# Patient Record
Sex: Female | Born: 1960 | Race: White | Hispanic: No | Marital: Single | Smoking: Never smoker
Health system: Southern US, Community
[De-identification: ages and names within clinical notes are randomized; demographics above are authoritative.]

## PROBLEM LIST (undated history)

## (undated) DIAGNOSIS — Z8719 Personal history of other diseases of the digestive system: Secondary | ICD-10-CM

## (undated) DIAGNOSIS — K219 Gastro-esophageal reflux disease without esophagitis: Secondary | ICD-10-CM

## (undated) DIAGNOSIS — Z8489 Family history of other specified conditions: Secondary | ICD-10-CM

## (undated) DIAGNOSIS — I00 Rheumatic fever without heart involvement: Secondary | ICD-10-CM

## (undated) DIAGNOSIS — R7301 Impaired fasting glucose: Secondary | ICD-10-CM

## (undated) DIAGNOSIS — I10 Essential (primary) hypertension: Secondary | ICD-10-CM

## (undated) DIAGNOSIS — E119 Type 2 diabetes mellitus without complications: Secondary | ICD-10-CM

## (undated) HISTORY — DX: Essential (primary) hypertension: I10

## (undated) HISTORY — DX: Impaired fasting glucose: R73.01

## (undated) HISTORY — DX: Gastro-esophageal reflux disease without esophagitis: K21.9

## (undated) HISTORY — PX: CERVICAL FUSION: SHX112

## (undated) HISTORY — PX: FOOT SURGERY: SHX648

---

## 1998-07-19 ENCOUNTER — Emergency Department (HOSPITAL_COMMUNITY): Admission: EM | Admit: 1998-07-19 | Discharge: 1998-07-19 | Payer: Self-pay | Admitting: Emergency Medicine

## 1998-07-19 ENCOUNTER — Encounter: Payer: Self-pay | Admitting: *Deleted

## 1999-08-18 ENCOUNTER — Emergency Department (HOSPITAL_COMMUNITY): Admission: EM | Admit: 1999-08-18 | Discharge: 1999-08-18 | Payer: Self-pay | Admitting: *Deleted

## 1999-08-23 ENCOUNTER — Emergency Department (HOSPITAL_COMMUNITY): Admission: EM | Admit: 1999-08-23 | Discharge: 1999-08-23 | Payer: Self-pay | Admitting: *Deleted

## 1999-08-23 ENCOUNTER — Encounter: Payer: Self-pay | Admitting: Emergency Medicine

## 1999-08-24 ENCOUNTER — Encounter: Payer: Self-pay | Admitting: Orthopaedic Surgery

## 1999-08-24 ENCOUNTER — Ambulatory Visit (HOSPITAL_COMMUNITY): Admission: RE | Admit: 1999-08-24 | Discharge: 1999-08-24 | Payer: Self-pay | Admitting: Orthopaedic Surgery

## 2000-06-14 ENCOUNTER — Emergency Department (HOSPITAL_COMMUNITY): Admission: EM | Admit: 2000-06-14 | Discharge: 2000-06-14 | Payer: Self-pay | Admitting: Emergency Medicine

## 2000-08-02 ENCOUNTER — Emergency Department (HOSPITAL_COMMUNITY): Admission: EM | Admit: 2000-08-02 | Discharge: 2000-08-02 | Payer: Self-pay | Admitting: Emergency Medicine

## 2002-05-10 ENCOUNTER — Emergency Department (HOSPITAL_COMMUNITY): Admission: EM | Admit: 2002-05-10 | Discharge: 2002-05-10 | Payer: Self-pay | Admitting: Emergency Medicine

## 2002-05-10 ENCOUNTER — Encounter: Payer: Self-pay | Admitting: Emergency Medicine

## 2002-10-26 ENCOUNTER — Emergency Department (HOSPITAL_COMMUNITY): Admission: EM | Admit: 2002-10-26 | Discharge: 2002-10-26 | Payer: Self-pay | Admitting: Emergency Medicine

## 2003-03-08 ENCOUNTER — Other Ambulatory Visit: Admission: RE | Admit: 2003-03-08 | Discharge: 2003-03-08 | Payer: Self-pay | Admitting: Family Medicine

## 2003-03-10 ENCOUNTER — Encounter: Admission: RE | Admit: 2003-03-10 | Discharge: 2003-03-10 | Payer: Self-pay | Admitting: Family Medicine

## 2003-03-10 ENCOUNTER — Encounter: Payer: Self-pay | Admitting: Family Medicine

## 2004-09-11 ENCOUNTER — Inpatient Hospital Stay (HOSPITAL_COMMUNITY): Admission: EM | Admit: 2004-09-11 | Discharge: 2004-09-12 | Payer: Self-pay | Admitting: Emergency Medicine

## 2007-06-19 HISTORY — PX: KNEE SURGERY: SHX244

## 2008-06-18 HISTORY — PX: CYST REMOVAL HAND: SHX6279

## 2012-11-24 ENCOUNTER — Other Ambulatory Visit: Payer: Self-pay | Admitting: *Deleted

## 2012-11-24 ENCOUNTER — Encounter: Payer: Managed Care, Other (non HMO) | Admitting: *Deleted

## 2012-11-24 DIAGNOSIS — E559 Vitamin D deficiency, unspecified: Secondary | ICD-10-CM

## 2012-11-24 DIAGNOSIS — Z0000 Encounter for general adult medical examination without abnormal findings: Secondary | ICD-10-CM

## 2012-11-24 LAB — COMPLETE METABOLIC PANEL WITH GFR
ALT: 20 U/L (ref 0–35)
AST: 21 U/L (ref 0–37)
Albumin: 4.1 g/dL (ref 3.5–5.2)
Alkaline Phosphatase: 46 U/L (ref 39–117)
BUN: 14 mg/dL (ref 6–23)
CO2: 28 mEq/L (ref 19–32)
Calcium: 9.1 mg/dL (ref 8.4–10.5)
Chloride: 103 mEq/L (ref 96–112)
Creat: 0.74 mg/dL (ref 0.50–1.10)
GFR, Est African American: >89 mL/min
GFR, Est Non African American: >89 mL/min
Glucose, Bld: 118 mg/dL — ABNORMAL HIGH (ref 70–99)
Potassium: 4.1 mEq/L (ref 3.5–5.3)
Sodium: 138 mEq/L (ref 135–145)
Total Bilirubin: 0.4 mg/dL (ref 0.3–1.2)
Total Protein: 6.9 g/dL (ref 6.0–8.3)

## 2012-11-24 LAB — LIPID PANEL
Cholesterol: 184 mg/dL (ref 0–200)
HDL: 62 mg/dL (ref >39)
LDL Cholesterol: 105 mg/dL — ABNORMAL HIGH (ref 0–99)
Total CHOL/HDL Ratio: 3 Ratio
Triglycerides: 83 mg/dL (ref <150)
VLDL: 17 mg/dL (ref 0–40)

## 2012-11-24 LAB — TSH: TSH: 1.95 u[IU]/mL (ref 0.350–4.500)

## 2012-11-25 LAB — VITAMIN D 25 HYDROXY (VIT D DEFICIENCY, FRACTURES): Vit D, 25-Hydroxy: 42 ng/mL (ref 30–89)

## 2012-11-26 ENCOUNTER — Ambulatory Visit (INDEPENDENT_AMBULATORY_CARE_PROVIDER_SITE_OTHER): Payer: Managed Care, Other (non HMO) | Admitting: Family Medicine

## 2012-11-26 ENCOUNTER — Encounter: Payer: Self-pay | Admitting: Family Medicine

## 2012-11-26 VITALS — BP 110/78 | HR 75 | Wt 162.0 lb

## 2012-11-26 DIAGNOSIS — R7301 Impaired fasting glucose: Secondary | ICD-10-CM

## 2012-11-26 DIAGNOSIS — F411 Generalized anxiety disorder: Secondary | ICD-10-CM

## 2012-11-26 DIAGNOSIS — I10 Essential (primary) hypertension: Secondary | ICD-10-CM

## 2012-11-26 DIAGNOSIS — K219 Gastro-esophageal reflux disease without esophagitis: Secondary | ICD-10-CM

## 2012-11-26 MED ORDER — LOSARTAN POTASSIUM-HCTZ 50-12.5 MG PO TABS: 1.0000 | ORAL_TABLET | Freq: Every day | ORAL | Status: DC

## 2012-11-26 MED ORDER — CANAGLIFLOZIN 100 MG PO TABS: 1.0000 | ORAL_TABLET | Freq: Every day | ORAL | Status: DC

## 2012-11-26 MED ORDER — DEXLANSOPRAZOLE 60 MG PO CPDR: 60.0000 mg | DELAYED_RELEASE_CAPSULE | Freq: Every day | ORAL | Status: DC

## 2012-11-26 NOTE — Progress Notes (Signed)
  Subjective:    Patient ID: Deborah [name], female    DOB: 10/05/1960, 52 y.o.   MRN: [id number]  HPI  Deborah [name] is here today to go over her lab results, get medication refills and to discuss a few issues:   1)  Hypertension:  Her blood pressure is well controlled with her losartan/HCTZ. She needs this medication refilled.  2)  GERD:  She has done well with her Dexilant and needs it to be refilled.    3)  Mood:  She is no longer taking Celexa.  She has been promoted to a different job position and her stress has improved so she does not feel that she needs to be on it.     Review of Systems  Constitutional: Negative for activity change, fatigue and unexpected weight change.  HENT: Negative.   Eyes: Negative.   Respiratory: Negative for shortness of breath.   Cardiovascular: Negative for chest pain, palpitations and leg swelling.  Gastrointestinal: Negative for diarrhea and constipation.  Endocrine: Negative.   Genitourinary: Negative for difficulty urinating.  Musculoskeletal: Negative.   Skin: Negative.   Neurological: Negative.  Negative for light-headedness.  Hematological: Negative for adenopathy. Does not bruise/bleed easily.  Psychiatric/Behavioral: Negative for sleep disturbance and dysphoric mood. The patient is not nervous/anxious.     Past Medical History  Diagnosis Date  . GERD (gastroesophageal reflux disease)   . Hypertension     Family History  Problem Relation Age of Onset  . Schizophrenia Mother   . Diabetes Mother     Type II  . Heart disease Father   . Hypertension Father   . Hyperlipidemia Father   . Heart disease Brother   . Hypertension Brother   . Cancer Brother     Colon Cancer  . CVA Maternal Aunt   . CVA Maternal Grandmother   . Heart disease Paternal Grandfather     History   Social History Narrative   Marital Status: Single    Sexual Orientation:  Lesbian    Children:  None    Pets: Cats (2)    Living Situation: Lives alone.    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 No distress.  HENT:  Head: Normocephalic.  Eyes: No scleral icterus.  Neck: No thyromegaly present.  Cardiovascular: Normal rate, regular rhythm and normal heart sounds.   Pulmonary/Chest: Effort normal and breath sounds normal.  Abdominal: There is no tenderness.  Musculoskeletal: She exhibits no edema and no tenderness.  Neurological: She is alert.  Skin: Skin is warm and dry.  Psychiatric: She has a normal mood and affect. Her behavior is normal. Judgment and thought content normal.          Assessment & Plan:

## 2012-12-20 ENCOUNTER — Encounter: Payer: Self-pay | Admitting: Family Medicine

## 2012-12-20 DIAGNOSIS — F411 Generalized anxiety disorder: Secondary | ICD-10-CM | POA: Insufficient documentation

## 2012-12-20 DIAGNOSIS — R7301 Impaired fasting glucose: Secondary | ICD-10-CM | POA: Insufficient documentation

## 2012-12-20 DIAGNOSIS — K219 Gastro-esophageal reflux disease without esophagitis: Secondary | ICD-10-CM | POA: Insufficient documentation

## 2012-12-20 DIAGNOSIS — I10 Essential (primary) hypertension: Secondary | ICD-10-CM | POA: Insufficient documentation

## 2012-12-20 NOTE — Assessment & Plan Note (Signed)
Her FBS was elevated to 118.  She is going to try some Invokana.

## 2012-12-20 NOTE — Assessment & Plan Note (Signed)
Deborah [name] seems to be doing okay with her mood as far as her work goes.  She is under some personal stress due to her elderly mother who is very ill.  She thinks that she is doing okay with this and does not need to be on medication.

## 2012-12-20 NOTE — Assessment & Plan Note (Signed)
Refilled her losartan-HCT 

## 2012-12-20 NOTE — Assessment & Plan Note (Signed)
Refilled her Dexilant.

## 2013-01-05 ENCOUNTER — Telehealth: Payer: Self-pay

## 2013-01-05 NOTE — Telephone Encounter (Signed)
A prior authorization form was received, filled out and faxed back to [facility] for Rx (Invokana) at this time. LB

## 2013-01-09 ENCOUNTER — Telehealth: Payer: Self-pay

## 2013-01-09 NOTE — Telephone Encounter (Signed)
A fax has been received stating that the Invokana has been approved effective 01/08/2013 and will remain in effect as long as the member is on the policy. LB  The pharmacy has been notified also.

## 2013-05-28 ENCOUNTER — Encounter: Payer: Self-pay | Admitting: Family Medicine

## 2013-05-28 ENCOUNTER — Ambulatory Visit (INDEPENDENT_AMBULATORY_CARE_PROVIDER_SITE_OTHER): Payer: Managed Care, Other (non HMO) | Admitting: Family Medicine

## 2013-05-28 VITALS — BP 124/79 | HR 65 | Resp 16 | Ht 62.0 in | Wt 152.0 lb

## 2013-05-28 DIAGNOSIS — R7301 Impaired fasting glucose: Secondary | ICD-10-CM

## 2013-05-28 DIAGNOSIS — L719 Rosacea, unspecified: Secondary | ICD-10-CM

## 2013-05-28 DIAGNOSIS — J309 Allergic rhinitis, unspecified: Secondary | ICD-10-CM

## 2013-05-28 LAB — POCT GLYCOSYLATED HEMOGLOBIN (HGB A1C): Hemoglobin A1C: 5.2

## 2013-05-28 MED ORDER — CANAGLIFLOZIN-METFORMIN HCL 50-500 MG PO TABS: 1.0000 | ORAL_TABLET | Freq: Two times a day (BID) | ORAL | Status: DC

## 2013-05-28 MED ORDER — AZELASTINE-FLUTICASONE 137-50 MCG/ACT NA SUSP: 1.0000 | Freq: Two times a day (BID) | NASAL | Status: DC

## 2013-05-28 MED ORDER — METRONIDAZOLE 0.75 % EX CREA: TOPICAL_CREAM | Freq: Two times a day (BID) | CUTANEOUS | Status: AC

## 2013-05-28 NOTE — Patient Instructions (Addendum)
1)  Blood Sugar - PERFECT with an A1c of 5.2%.  We are going to change your medication to Invokamet 50/500 1 pill at night.    2)  BP - PERFECT you can try taking 1/2 tab daily.  3)  Skin - You appear to have Rosacea.  Try the metrocream twice a day.  4)  Allergies - Try the Dymista and the Norel AD.  You would also benefit from the [place] Med Sinus Rinse (Distilled Water) or Simply Saline.     Rosacea Rosacea is a long-term (chronic) condition that affects the skin of the face (cheeks, nose, brow, and chin) and sometimes the eyes. Rosacea causes the blood vessels near the surface of the skin to enlarge, resulting in redness. This condition usually begins after age 43. It occurs most often in light-skinned women. Without treatment, rosacea tends to get worse over time. There is no cure for rosacea, but treatment can help control your symptoms. CAUSES  The cause is unknown. It is thought that some people may inherit a tendency to develop rosacea. Certain triggers can make your rosacea worse, including:  Hot baths.  Exercise.  Sunlight.  Very hot or cold temperatures.  Hot or spicy foods and drinks.  Drinking alcohol.  Stress.  Taking blood pressure medicine.  Long-term use of topical steroids on the face. SYMPTOMS   Redness of the face.  Red bumps or pimples on the face.  Red, enlarged nose (rhinophyma).  Blushing easily.  Red lines on the skin.  Irritated or burning feeling in the eyes.  Swollen eyelids. DIAGNOSIS  Your caregiver can usually tell what is wrong by asking about your symptoms and performing a physical exam. TREATMENT  Avoiding triggers is an important part of treatment. You will also need to see a skin specialist (dermatologist) who can develop a treatment plan for you. The goals of treatment are to control your condition and to improve the appearance of your skin. It may take several weeks or months of treatment before you notice an improvement in your  skin. Even after your skin improves, you will likely need to continue treatment to prevent your rosacea from coming back. Treatment methods may include:  Using sunscreen or sunblock daily to protect the skin.  Antibiotic medicine, such as metronidazole, applied directly to the skin.  Antibiotics taken by mouth. This is usually prescribed if you have eye problems from your rosacea.  Laser surgery to improve the appearance of the skin. This surgery can reduce the appearance of red lines on the skin and can remove excess tissue from the nose to reduce its size. HOME CARE INSTRUCTIONS  Avoid things that seem to trigger your flare-ups.  If you are given antibiotics, take them as directed. Finish them even if you start to feel better.  Use a gentle facial cleanser that does not contain alcohol.  You may use a mild facial moisturizer.  Use a sunscreen or sunblock with SPF 30 or greater.  Wear a green-tinted foundation powder to conceal redness, if needed. Choose cosmetics that are noncomedogenic. This means they do not block your pores.  If your eyelids are affected, apply warm compresses to the eyelids. Do this up to 4 times a day or as directed by your caregiver. SEEK MEDICAL CARE IF:  Your skin problems get worse.  You feel depressed.  You lose your appetite.  You have trouble concentrating.  You have problems with your eyes, such as redness or itching. MAKE SURE YOU:  Understand these instructions.                                                        Will watch your condition.  Will get help right away if you are not doing well or get worse. Document Released: 07/12/2004 Document Revised: 12/04/2011 Document Reviewed: 05/15/2011 [facility] Patient Information 2014 [place], [place].

## 2013-05-28 NOTE — Progress Notes (Signed)
Subjective:    Patient ID: Deborah [name], female    DOB: [birth year], 52 y.o.   MRN: [id number]  HPI  Deborah [name] is here today to discuss the following conditions:       1)  IFG:  She has been taking Invokana (100 mg, once daily).  She has not had major side effects but every now and then she has experienced some "shaking."  She wonders if this is related to the Invokana.  She has been following Weight [employer] and has lost 10 lbs since her last office visit.    2)  Blood Pressure:  Her blood pressure remains controlled with her losartan (50/12.5 mg, once daily).   Review of Systems  Constitutional: Negative for activity change, fatigue and unexpected weight change.  HENT: Negative.   Eyes: Negative.   Respiratory: Negative for shortness of breath.   Cardiovascular: Negative for chest pain, palpitations and leg swelling.  Gastrointestinal: Negative for diarrhea and constipation.  Endocrine: Negative.   Genitourinary: Negative for difficulty urinating.  Musculoskeletal: Negative.   Skin: Negative.   Neurological: Negative for light-headedness.       She has felt a little "shaky" at times.    Hematological: Negative for adenopathy. Does not bruise/bleed easily.  Psychiatric/Behavioral: Negative for sleep disturbance and dysphoric mood. The patient is not nervous/anxious.   All other systems reviewed and are negative.     Past Medical History  Diagnosis Date  . GERD (gastroesophageal reflux disease)   . Hypertension      Past Surgical History  Procedure Laterality Date  . Foot surgery    . Knee surgery  2009    Right Knee  . Cyst removal hand  2010    Left Wrist  . Cervical fusion      C6     History   Social History Narrative   Marital Status: Single    Sexual Orientation:  Lesbian    Children:  None    Pets: Cats (2)    Living Situation: Lives alone.    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 Schizophrenia Mother   . Diabetes Mother     Type II  . Heart disease Father   . Hypertension Father   . Hyperlipidemia Father   . Heart disease Brother   . Hypertension Brother   . Cancer Brother     Colon Cancer  . CVA Maternal Aunt   . CVA Maternal Grandmother   . Heart disease Paternal Grandfather      Current Outpatient Prescriptions on File Prior to Visit  Medication Sig Dispense Refill  . Canagliflozin (INVOKANA) 100 MG TABS Take 1 tablet (100 mg total) by mouth daily.  30 tablet  5  . dexlansoprazole (DEXILANT) 60 MG capsule Take 1 capsule (60 mg total) by mouth daily.  30 capsule  11  . losartan-hydrochlorothiazide (HYZAAR) 50-12.5 MG per tablet Take 1 tablet by mouth daily.  30 tablet  11   No current facility-administered medications on file prior to visit.     No Known Allergies   Immunization History  Administered Date(s) Administered  . Tdap 12/07/2009      Objective:   Physical Exam  Vitals reviewed. Constitutional: She is oriented to person, place, and time.  Eyes: Conjunctivae are normal. No scleral icterus.  Neck: Neck supple. No thyromegaly present.  Cardiovascular: Normal rate, regular rhythm and normal heart sounds.   Pulmonary/Chest: Effort normal and breath sounds normal.  Musculoskeletal: She exhibits no edema and no tenderness.  Lymphadenopathy:    She has no cervical adenopathy.  Neurological: She is alert and oriented to person, place, and time.  Skin: Skin is warm and dry. There is erythema.  Psychiatric: She has a normal mood and affect. Her behavior is normal. Judgment and thought content normal.      Assessment & Plan:    Deborah [name] was seen today for medication management.  Diagnoses  and associated orders for this visit:  IFG (impaired fasting glucose) - POCT glycosylated hemoglobin (Hb A1C) - Canagliflozin-Metformin HCl (INVOKAMET) 50-500 MG TABS; Take 1 tablet by mouth 2 (two) times daily.  Rosacea - metroNIDAZOLE (METROCREAM) 0.75 % cream; Apply topically 2 (two) times daily.  Allergic rhinitis - Azelastine-Fluticasone 137-50 MCG/ACT SUSP; Place 1 spray into the nose 2 (two) times daily.   TIME SPENT "FACE TO FACE" WITH PATIENT -  30 MINS

## 2013-08-18 ENCOUNTER — Encounter (INDEPENDENT_AMBULATORY_CARE_PROVIDER_SITE_OTHER): Payer: Self-pay

## 2013-08-18 ENCOUNTER — Ambulatory Visit (INDEPENDENT_AMBULATORY_CARE_PROVIDER_SITE_OTHER): Payer: Managed Care, Other (non HMO) | Admitting: Family Medicine

## 2013-08-18 ENCOUNTER — Encounter: Payer: Self-pay | Admitting: Family Medicine

## 2013-08-18 ENCOUNTER — Other Ambulatory Visit (HOSPITAL_COMMUNITY)
Admission: RE | Admit: 2013-08-18 | Discharge: 2013-08-18 | Disposition: A | Discharge status: Home / Self Care | Payer: Managed Care, Other (non HMO) | Source: Ambulatory Visit | Attending: Family Medicine | Admitting: Family Medicine

## 2013-08-18 VITALS — BP 145/91 | HR 61 | Resp 16 | Ht 62.0 in | Wt 152.0 lb

## 2013-08-18 DIAGNOSIS — Z01419 Encounter for gynecological examination (general) (routine) without abnormal findings: Secondary | ICD-10-CM | POA: Insufficient documentation

## 2013-08-18 DIAGNOSIS — Z1151 Encounter for screening for human papillomavirus (HPV): Secondary | ICD-10-CM | POA: Insufficient documentation

## 2013-08-18 DIAGNOSIS — Z0000 Encounter for general adult medical examination without abnormal findings: Secondary | ICD-10-CM

## 2013-08-18 DIAGNOSIS — Z124 Encounter for screening for malignant neoplasm of cervix: Secondary | ICD-10-CM

## 2013-08-18 LAB — POCT URINALYSIS DIPSTICK
Bilirubin, UA: NEGATIVE
Blood, UA: NEGATIVE
Ketones, UA: NEGATIVE
Leukocytes, UA: NEGATIVE
Nitrite, UA: NEGATIVE
Protein, UA: NEGATIVE
Spec Grav, UA: 1.01
Urobilinogen, UA: NEGATIVE
pH, UA: 5

## 2013-08-18 NOTE — Progress Notes (Signed)
Subjective:    Patient ID: Deborah [name], female    DOB: September 29, 1960, 53 y.o.   MRN: [id number]  HPI  Nyrah is here today for her annual CPE with pap smear.  She has done well since her last office visit.  Her LMP was on 05/18/2014.   Review of Systems  Constitutional: Negative for activity change, appetite change, fatigue and unexpected weight change.  HENT: Negative for congestion, dental problem, ear pain, hearing loss, trouble swallowing and voice change.   Eyes: Negative for pain, redness and visual disturbance.  Respiratory: Negative for cough and shortness of breath.   Cardiovascular: Negative for chest pain, palpitations and leg swelling.  Gastrointestinal: Negative for nausea, vomiting, abdominal pain, diarrhea, constipation and blood in stool.  Endocrine: Negative for cold intolerance, heat intolerance, polydipsia, polyphagia and polyuria.  Genitourinary: Negative for dysuria, urgency, frequency, hematuria, vaginal discharge and pelvic pain.  Musculoskeletal: Negative for arthralgias, back pain, joint swelling, myalgias and neck pain.  Skin: Negative for rash.  Neurological: Negative for dizziness, weakness and headaches.  Hematological: Negative for adenopathy. Does not bruise/bleed easily.  Psychiatric/Behavioral: Negative for sleep disturbance, dysphoric mood and decreased concentration. The patient is not nervous/anxious.      Past Medical History  Diagnosis Date  . GERD (gastroesophageal reflux disease)   . Hypertension      Past Surgical History  Procedure Laterality Date  . Foot surgery    . Knee surgery  2009    Right Knee  . Cyst removal hand  2010    Left Wrist  . Cervical fusion      C6     History   Social History Narrative   Marital Status: Single    Sexual Orientation:  Lesbian    Children:  None    Pets: Cats (2)    Living Situation: Lives alone.    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 Schizophrenia Mother   . Diabetes Mother     Type II  . Heart disease Father   . Hypertension Father   . Hyperlipidemia Father   . Heart disease Brother   . Hypertension Brother   . Cancer Brother     Colon Cancer  . CVA Maternal Aunt   . CVA Maternal Grandmother   . Heart disease Paternal Grandfather      Current Outpatient Prescriptions on File Prior to Visit  Medication Sig Dispense Refill  . Canagliflozin-Metformin HCl (INVOKAMET) 50-500 MG TABS Take 1 tablet by mouth 2 (two) times daily.  60 tablet  5  . dexlansoprazole (DEXILANT) 60 MG capsule Take 1 capsule (60 mg total) by mouth daily.  30 capsule  11  . losartan-hydrochlorothiazide (HYZAAR) 50-12.5 MG per tablet Take 1 tablet by mouth daily.  30 tablet  11  . metroNIDAZOLE (METROCREAM) 0.75 % cream Apply topically 2 (two) times daily.  60 g  11  . Azelastine-Fluticasone 137-50 MCG/ACT SUSP Place 1 spray into the nose 2 (two) times daily.  1 Bottle  11   No current facility-administered medications on file prior to visit.  No Known Allergies   Immunization History  Administered Date(s) Administered  . Tdap 12/07/2009       Objective:   Physical Exam  Constitutional: She is oriented to person, place, and time. She appears well-developed and well-nourished.  HENT:  Head: Normocephalic and atraumatic.  Right Ear: External ear normal.  Left Ear: External ear normal.  Nose: Nose normal.  Mouth/Throat: Oropharynx is clear and moist.  Eyes: Conjunctivae and EOM are normal. Pupils are equal, round, and reactive to light.  Neck: Normal range of motion. No thyromegaly present.  Cardiovascular: Normal rate, regular rhythm, normal heart sounds and intact  distal pulses.  Exam reveals no gallop and no friction rub.   No murmur heard. Pulmonary/Chest: Effort normal and breath sounds normal. Right breast exhibits no inverted nipple, no mass, no nipple discharge, no skin change and no tenderness. Left breast exhibits no inverted nipple, no mass, no nipple discharge, no skin change and no tenderness. Breasts are symmetrical.  Abdominal: Soft. Bowel sounds are normal. Hernia confirmed negative in the right inguinal area and confirmed negative in the left inguinal area.  Genitourinary: Vagina normal and uterus normal. Pelvic exam was performed with patient supine. There is no rash, tenderness or lesion on the right labia. There is no rash, tenderness or lesion on the left labia. No vaginal discharge found.  Musculoskeletal: Normal range of motion. She exhibits no edema and no tenderness.  Lymphadenopathy:    She has no cervical adenopathy.       Right: No inguinal adenopathy present.       Left: No inguinal adenopathy present.  Neurological: She is alert and oriented to person, place, and time. She has normal reflexes.  Skin: Skin is warm and dry.  Psychiatric: She has a normal mood and affect. Her behavior is normal. Judgment and thought content normal.      Assessment & Plan:  Samara DeistKathryn was seen today for annual exam.  Diagnoses and associated orders for this visit:  Routine general medical examination at a health care facility The patient had a normal CPE.  We addressed preventative issues appropriate for her age.  Her U/A and EKG were WNL.   - POCT urinalysis dipstick - EKG 12-Lead  Screening for malignant neoplasm of the cervix Pap smear was done without any difficulty and was [facility]. She will receive her results in the mail. She was told that if her result is abnormal, we will bring her in to discuss the abnormality in great detail.  - Cytology - PAP

## 2013-09-14 ENCOUNTER — Encounter: Payer: Self-pay | Admitting: *Deleted

## 2013-09-15 ENCOUNTER — Encounter: Payer: Self-pay | Admitting: *Deleted

## 2013-10-05 ENCOUNTER — Encounter: Payer: Self-pay | Admitting: Family Medicine

## 2013-11-13 ENCOUNTER — Other Ambulatory Visit: Payer: Self-pay | Admitting: Family Medicine

## 2013-11-14 ENCOUNTER — Other Ambulatory Visit: Payer: Self-pay | Admitting: Family Medicine

## 2013-11-19 ENCOUNTER — Other Ambulatory Visit: Payer: Self-pay | Admitting: *Deleted

## 2013-11-19 ENCOUNTER — Other Ambulatory Visit: Payer: Self-pay | Admitting: Family Medicine

## 2013-11-19 ENCOUNTER — Other Ambulatory Visit: Payer: Managed Care, Other (non HMO)

## 2013-11-19 DIAGNOSIS — Z0000 Encounter for general adult medical examination without abnormal findings: Secondary | ICD-10-CM

## 2013-11-19 DIAGNOSIS — E785 Hyperlipidemia, unspecified: Secondary | ICD-10-CM

## 2013-11-19 DIAGNOSIS — I10 Essential (primary) hypertension: Secondary | ICD-10-CM

## 2013-11-19 LAB — CBC WITH DIFFERENTIAL/PLATELET
Basophils Absolute: 0 10*3/uL (ref 0.0–0.1)
Basophils Relative: 0 % (ref 0–1)
Eosinophils Absolute: 0.1 10*3/uL (ref 0.0–0.7)
Eosinophils Relative: 3 % (ref 0–5)
HCT: 39.1 % (ref 36.0–46.0)
Hemoglobin: 13.8 g/dL (ref 12.0–15.0)
Lymphocytes Relative: 31 % (ref 12–46)
Lymphs Abs: 0.9 10*3/uL (ref 0.7–4.0)
MCH: 33.9 pg (ref 26.0–34.0)
MCHC: 35.3 g/dL (ref 30.0–36.0)
MCV: 96.1 fL (ref 78.0–100.0)
Monocytes Absolute: 0.3 10*3/uL (ref 0.1–1.0)
Monocytes Relative: 10 % (ref 3–12)
Neutro Abs: 1.6 10*3/uL — ABNORMAL LOW (ref 1.7–7.7)
Neutrophils Relative %: 56 % (ref 43–77)
Platelets: 250 10*3/uL (ref 150–400)
RBC: 4.07 MIL/uL (ref 3.87–5.11)
RDW: 12.9 % (ref 11.5–15.5)
WBC: 2.9 10*3/uL — ABNORMAL LOW (ref 4.0–10.5)

## 2013-11-19 LAB — LIPID PANEL
Cholesterol: 189 mg/dL (ref 0–200)
HDL: 62 mg/dL (ref >39)
LDL Cholesterol: 118 mg/dL — ABNORMAL HIGH (ref 0–99)
Total CHOL/HDL Ratio: 3 Ratio
Triglycerides: 47 mg/dL (ref <150)
VLDL: 9 mg/dL (ref 0–40)

## 2013-11-19 LAB — COMPLETE METABOLIC PANEL WITH GFR
ALT: 15 U/L (ref 0–35)
AST: 20 U/L (ref 0–37)
Albumin: 3.9 g/dL (ref 3.5–5.2)
Alkaline Phosphatase: 41 U/L (ref 39–117)
BUN: 18 mg/dL (ref 6–23)
CO2: 29 mEq/L (ref 19–32)
Calcium: 9 mg/dL (ref 8.4–10.5)
Chloride: 103 mEq/L (ref 96–112)
Creat: 0.79 mg/dL (ref 0.50–1.10)
GFR, Est African American: >89 mL/min
GFR, Est Non African American: 86 mL/min
Glucose, Bld: 86 mg/dL (ref 70–99)
Potassium: 4.5 mEq/L (ref 3.5–5.3)
Sodium: 140 mEq/L (ref 135–145)
Total Bilirubin: 0.4 mg/dL (ref 0.2–1.2)
Total Protein: 6.6 g/dL (ref 6.0–8.3)

## 2013-11-20 LAB — TSH: TSH: 1.545 u[IU]/mL (ref 0.350–4.500)

## 2013-11-24 ENCOUNTER — Ambulatory Visit (INDEPENDENT_AMBULATORY_CARE_PROVIDER_SITE_OTHER): Payer: Managed Care, Other (non HMO) | Admitting: Family Medicine

## 2013-11-24 ENCOUNTER — Encounter: Payer: Self-pay | Admitting: Family Medicine

## 2013-11-24 VITALS — BP 133/82 | HR 76 | Resp 16 | Ht 62.0 in | Wt 142.0 lb

## 2013-11-24 DIAGNOSIS — I10 Essential (primary) hypertension: Secondary | ICD-10-CM

## 2013-11-24 DIAGNOSIS — K219 Gastro-esophageal reflux disease without esophagitis: Secondary | ICD-10-CM

## 2013-11-24 DIAGNOSIS — R232 Flushing: Secondary | ICD-10-CM

## 2013-11-24 DIAGNOSIS — R7301 Impaired fasting glucose: Secondary | ICD-10-CM

## 2013-11-24 LAB — FSH/LH
FSH: 63.4 m[IU]/mL
LH: 24.2 m[IU]/mL

## 2013-11-24 LAB — POCT GLYCOSYLATED HEMOGLOBIN (HGB A1C): Hemoglobin A1C: 5.3

## 2013-11-24 MED ORDER — LOSARTAN POTASSIUM-HCTZ 50-12.5 MG PO TABS: 1.0000 | ORAL_TABLET | Freq: Every day | ORAL | Status: AC

## 2013-11-24 MED ORDER — DEXLANSOPRAZOLE 60 MG PO CPDR: 60.0000 mg | DELAYED_RELEASE_CAPSULE | Freq: Every day | ORAL | Status: AC

## 2013-11-24 MED ORDER — CANAGLIFLOZIN-METFORMIN HCL 50-500 MG PO TABS: 1.0000 | ORAL_TABLET | Freq: Two times a day (BID) | ORAL | Status: AC

## 2013-11-24 MED ORDER — CANAGLIFLOZIN-METFORMIN HCL 50-500 MG PO TABS: 1.0000 | ORAL_TABLET | Freq: Two times a day (BID) | ORAL | Status: DC

## 2013-11-24 NOTE — Progress Notes (Signed)
Subjective:    Patient ID: Deborah [name], female    DOB: 07-31-1960, 53 y.o.   MRN: [id number]  HPI  Deborah [name] is here today to follow up on her recent lab results. She is needing medication refills.  1)  Hypertension:  She is doing well on the Hyzaar.  2)  GERD:  Controlled well on the Dexilant.  3)  IFG:  She is doing well on the Invokamet.    Review of Systems  Constitutional: Negative for activity change, appetite change and fatigue.  Cardiovascular: Negative for chest pain, palpitations and leg swelling.  Endocrine: Negative for polydipsia, polyphagia and polyuria.  Psychiatric/Behavioral: Negative for behavioral problems.  All other systems reviewed and are negative.    Past Medical History  Diagnosis Date  . GERD (gastroesophageal reflux disease)   . Hypertension   . Impaired fasting glucose      Past Surgical History  Procedure Laterality Date  . Foot surgery    . Knee surgery  2009    Right Knee  . Cyst removal hand  2010    Left Wrist  . Cervical fusion      C6     History   Social History Narrative   Marital Status: Single    Sexual Orientation:  Lesbian    Children:  None    Pets: Cats (2)    Living Situation: Lives alone.    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 Schizophrenia Mother   . Diabetes Mother     Type II  . Heart disease Father   . Hypertension Father   . Hyperlipidemia Father   . Heart disease Brother   . Hypertension Brother   . Cancer Brother     Colon Cancer  . CVA Maternal Aunt   . CVA Maternal Grandmother   . Heart disease Paternal Grandfather      Current Outpatient Prescriptions on File Prior to Visit    Medication Sig Dispense Refill  . metroNIDAZOLE (METROCREAM) 0.75 % cream Apply topically 2 (two) times daily.  60 g  11   No current facility-administered medications on file prior to visit.     No Known Allergies   Immunization History  Administered Date(s) Administered  . Tdap 12/07/2009       Objective:   Physical Exam  Nursing note and vitals reviewed. Constitutional: She is oriented to person, place, and time.  Eyes: Conjunctivae are normal. No scleral icterus.  Neck: Neck supple. No thyromegaly present.  Cardiovascular: Normal rate, regular rhythm and normal heart sounds.   Pulmonary/Chest: Effort normal and breath sounds normal.  Musculoskeletal: She exhibits no edema and no tenderness.  Lymphadenopathy:    She has no cervical adenopathy.  Neurological: She is alert and oriented to person, place, and time.  Skin: Skin is warm and dry.  Psychiatric: She has a normal mood and affect. Her behavior is normal. Judgment and thought content normal.      Assessment & Plan:    Adell was seen today for medication management.  Diagnoses and associated orders for this visit:  Essential hypertension, benign - losartan-hydrochlorothiazide (HYZAAR) 50-12.5 MG per tablet; Take 1 tablet by mouth daily.  Gastroesophageal reflux disease, esophagitis presence not specified - dexlansoprazole (DEXILANT) 60 MG capsule; Take 1 capsule (60 mg total) by mouth daily.  Impaired fasting glucose - POCT HgB A1C  IFG (impaired fasting glucose) Comments: She is to take one of the Invokamet 50/500 daily.    - Canagliflozin-Metformin HCl (INVOKAMET) 50-500 MG TABS; Take 1 tablet by mouth 2 (two) times daily.  [place] Comments: Checking a FSH/LH.     TIME SPENT "FACE TO FACE" WITH PATIENT -  30 MINS

## 2014-02-01 ENCOUNTER — Encounter: Payer: Self-pay | Admitting: Family Medicine

## 2014-02-01 DIAGNOSIS — R7301 Impaired fasting glucose: Secondary | ICD-10-CM | POA: Insufficient documentation

## 2014-02-01 DIAGNOSIS — R232 Flushing: Secondary | ICD-10-CM | POA: Insufficient documentation

## 2016-05-22 ENCOUNTER — Ambulatory Visit (INDEPENDENT_AMBULATORY_CARE_PROVIDER_SITE_OTHER): Payer: Managed Care, Other (non HMO) | Admitting: Orthopaedic Surgery

## 2016-05-22 ENCOUNTER — Ambulatory Visit (INDEPENDENT_AMBULATORY_CARE_PROVIDER_SITE_OTHER): Payer: Managed Care, Other (non HMO)

## 2016-05-22 ENCOUNTER — Encounter (INDEPENDENT_AMBULATORY_CARE_PROVIDER_SITE_OTHER): Payer: Self-pay | Admitting: Orthopaedic Surgery

## 2016-05-22 VITALS — Ht 62.0 in | Wt 152.0 lb

## 2016-05-22 DIAGNOSIS — M25561 Pain in right knee: Secondary | ICD-10-CM

## 2016-05-22 DIAGNOSIS — G8929 Other chronic pain: Secondary | ICD-10-CM

## 2016-05-22 MED ORDER — METHYLPREDNISOLONE ACETATE 40 MG/ML IJ SUSP
40.0000 mg | INTRAMUSCULAR | Status: AC | PRN
  Administered 2016-05-22: 40 mg via INTRA_ARTICULAR

## 2016-05-22 MED ORDER — LIDOCAINE HCL 1 % IJ SOLN
3.0000 mL | INTRAMUSCULAR | Status: AC | PRN
  Administered 2016-05-22: 3 mL

## 2016-05-22 NOTE — Progress Notes (Signed)
Office Visit Note   Patient: Deborah [name]           Date of Birth: 1961-01-18           MRN: [id number] Visit Date: 05/22/2016              Requested by: Gillian Scarceobyn K Zanard, MD [address] [place], [place] [address] PCP: Birdena JubileeZANARD, ROBYN, MD   Assessment & Plan: Visit Diagnoses:  1. Chronic pain of right knee     Plan: She tolerated the steroid injection well and her right knee. At this point we will order hyaluronic acid for her knee to see if this will help buy her some time and created a stable environment for the knee in terms of a spongy cushioning they can help decrease her pain. We'll see her back on that injection is approved and comes in.  Follow-Up Instructions: Return in about 4 weeks (around 06/19/2016).   Orders:  Orders Placed This Encounter  Procedures  . Large Joint Injection/Arthrocentesis  . XR Knee 1-2 Views Right   No orders of the defined types were placed in this encounter.     Procedures: Large Joint Inj Date/Time: 05/22/2016 8:37 AM Performed by: Kathryne HitchBLACKMAN, Tannen Vandezande Y Authorized by: Kathryne HitchBLACKMAN, Shanita Kanan Y   Location:  Knee Site:  R knee Approach:  Anterolateral Ultrasound Guidance: No   Fluoroscopic Guidance: No   Arthrogram: No   Medications:  3 mL lidocaine 1 %; 40 mg methylPREDNISolone acetate 40 MG/ML     Clinical Data: No additional findings.   Subjective: Chief Complaint  Patient presents with  . Right Knee - Pain    Right knee pain x 1 year. She complains of giving way, most recently yesterday. Pain medial right knee where she believes knot to be. She had to dc exercise due to her pain.   The patient's well-known to me. We performed arthroscopic intervention of this right knee almost a decade ago. We had to remove some of her meniscus due to the complex medial meniscal tear. Time her cartilage looked good throughout the knee. She's been developing some locking catching in her right knee now. She has pain with pivoting  activity and saw the medial joint line of her knees where she points to. She did have a injection of steroid in the fall of this year which did provide some temporary relief for about a month. The pain is worsening again and she points again to the medial side of her knee. HPI  Review of Systems He currently denies any headache, shortness of breath, fever, chills, nausea, vomiting, chest pain.  Objective: Vital Signs: Ht 5\' 2"  (1.575 m)   Wt 152 lb (68.9 kg)   BMI 27.80 kg/m   Physical Exam He is alert and oriented 3 in no acute distress Ortho Exam Examination of her right knee shows no significant effusion. She has a varus deformity that needed to easily correctable. She'll has medial joint line tenderness and pain with pivoting the tibia on the femur and the medial compartment. Her Lockman's shows some slight loosening of that knee but they're stable endpoints. Her lateral knee examined patellofemoral compartment exam are all or both. Range of motions full. Specialty Comments:  No specialty comments available.  Imaging: Xr Knee 1-2 Views Right  Result Date: 05/22/2016 An AP and lateral of her right knee are obtained today. She has significant medial compartment arthritis of her right knee. It's almost bone-on-bone wear this point. The lateral  side looks good as well as the patellofemoral joint. The medial side does have joint space narrowing, para trigger osteophytes, and sclerotic changes.    PMFS History: Patient Active Problem List   Diagnosis Date Noted  . IFG (impaired fasting glucose) 02/01/2014  . Flushing 02/01/2014  . GERD (gastroesophageal reflux disease) 12/20/2012  . Anxiety state, unspecified 12/20/2012  . Essential hypertension, benign 12/20/2012  . Impaired fasting glucose 12/20/2012   Past Medical History:  Diagnosis Date  . GERD (gastroesophageal reflux disease)   . Hypertension   . Impaired fasting glucose     Family History  Problem Relation Age of  Onset  . Schizophrenia Mother   . Diabetes Mother     Type II  . Heart disease Father   . Hypertension Father   . Hyperlipidemia Father   . Heart disease Brother   . Hypertension Brother   . Cancer Brother     Colon Cancer  . CVA Maternal Aunt   . CVA Maternal Grandmother   . Heart disease Paternal Grandfather     Past Surgical History:  Procedure Laterality Date  . CERVICAL FUSION     C6  . CYST REMOVAL HAND  2010   Left Wrist  . FOOT SURGERY    . KNEE SURGERY  2009   Right Knee   Social History   Occupational History  . Not on file.   Social History Main Topics  . Smoking status: Never Smoker  . Smokeless tobacco: Never Used  . Alcohol use 18.6 oz/week    3 Cans of beer, 28 Shots of liquor per week  . Drug use: No  . Sexual activity: Yes    Partners: Female

## 2016-05-30 ENCOUNTER — Telehealth (INDEPENDENT_AMBULATORY_CARE_PROVIDER_SITE_OTHER): Payer: Self-pay | Admitting: Orthopaedic Surgery

## 2016-05-30 NOTE — Telephone Encounter (Signed)
Patient approved for injection and has appt in Jan

## 2016-05-30 NOTE — Telephone Encounter (Signed)
Adam from [employer] wants to check on prior authorization for "monovisc" for pt. Adam's number is [phone]

## 2016-06-20 ENCOUNTER — Encounter (INDEPENDENT_AMBULATORY_CARE_PROVIDER_SITE_OTHER): Payer: Self-pay | Admitting: Orthopaedic Surgery

## 2016-06-20 ENCOUNTER — Ambulatory Visit (INDEPENDENT_AMBULATORY_CARE_PROVIDER_SITE_OTHER): Payer: Managed Care, Other (non HMO) | Admitting: Physician Assistant

## 2016-06-20 DIAGNOSIS — M1711 Unilateral primary osteoarthritis, right knee: Secondary | ICD-10-CM

## 2016-06-20 MED ORDER — HYALURONAN 88 MG/4ML IX SOSY
88.0000 mg | PREFILLED_SYRINGE | INTRA_ARTICULAR | Status: AC | PRN
Start: 2016-06-20 — End: 2016-06-20
  Administered 2016-06-20: 88 mg via INTRA_ARTICULAR

## 2016-06-20 NOTE — Progress Notes (Signed)
   Procedure Note  Patient: Deborah [name]             Date of Birth: 08/25/1960           MRN: [id number]             Visit Date: 06/20/2016  Procedures: Visit Diagnoses: Primary osteoarthritis of right knee  Large Joint Inj Date/Time: 06/20/2016 11:16 AM Performed by: Kirtland BouchardLARK, Chaos Carlile W Authorized by: Kirtland BouchardLARK, Novelle Addair W   Consent Given by:  Patient Indications:  Pain Location:  Knee Site:  R knee Needle Size:  22 G Approach:  Anterolateral Ultrasound Guidance: No   Fluoroscopic Guidance: No   Arthrogram: No   Medications:  88 mg Hyaluronan 88 MG/4ML Aspiration Attempted: No   Patient tolerance:  Patient tolerated the procedure well with no immediate complications

## 2016-08-21 ENCOUNTER — Encounter (INDEPENDENT_AMBULATORY_CARE_PROVIDER_SITE_OTHER): Payer: Self-pay | Admitting: Orthopaedic Surgery

## 2016-08-21 ENCOUNTER — Ambulatory Visit (INDEPENDENT_AMBULATORY_CARE_PROVIDER_SITE_OTHER): Payer: Managed Care, Other (non HMO) | Admitting: Orthopaedic Surgery

## 2016-08-21 DIAGNOSIS — M1711 Unilateral primary osteoarthritis, right knee: Secondary | ICD-10-CM

## 2016-08-21 NOTE — Progress Notes (Signed)
The patient is well-known to me. She has known medial compartment osteoarthritis of her right knee. We performed arthroscopic surgery on his knee years ago had to perform a partial medial meniscectomy. With time her pain is worsening on the medial aspect of her knee. She still denies any locking and catching. She has had a intra-articular steroid injection as well as more recently it a hyaluronic acid injection.  On examination of her right knee she has pain only over medial joint line. There is no significant patellofemoral crepitation. She has a varus deformity with easily correctable. Her Lockman's exam is negative. She has excellent range of motion of her right knee. There is no effusion.  We long discussion about partial knee replacement surgery. I showed her a knee model and explained in detail with the surgery involves. She said she is not ready yet for this surgery and is working on [employer] aspects of things. She knows to give us a call if things worsen.

## 2016-09-09 ENCOUNTER — Encounter (INDEPENDENT_AMBULATORY_CARE_PROVIDER_SITE_OTHER): Payer: Self-pay | Admitting: Orthopaedic Surgery

## 2016-09-10 ENCOUNTER — Telehealth (INDEPENDENT_AMBULATORY_CARE_PROVIDER_SITE_OTHER): Payer: Self-pay | Admitting: *Deleted

## 2016-09-10 ENCOUNTER — Encounter (INDEPENDENT_AMBULATORY_CARE_PROVIDER_SITE_OTHER): Payer: Self-pay | Admitting: Orthopaedic Surgery

## 2016-09-10 NOTE — Telephone Encounter (Signed)
Please advise 

## 2016-09-10 NOTE — Telephone Encounter (Signed)
Patient called in this morning in regards to wanting to go ahead and possibly scheduling surgery. She is having some severe pain in her inner right knee. Patient stated it had been discussed to have partial knee replacement and she would like to go ahead and get the ball rolling for surgery please. Thank you Her CB # [phone]  [phone].

## 2016-10-16 NOTE — Progress Notes (Signed)
Please place orders in EPIC as patient is being scheduled for a pre-op appointment! Thank you! 

## 2016-10-17 ENCOUNTER — Encounter (HOSPITAL_COMMUNITY): Payer: Self-pay

## 2016-10-17 ENCOUNTER — Encounter (HOSPITAL_COMMUNITY)
Admission: RE | Admit: 2016-10-17 | Discharge: 2016-10-17 | Disposition: A | Discharge status: Home / Self Care | Payer: Managed Care, Other (non HMO) | Source: Ambulatory Visit | Attending: Orthopaedic Surgery | Admitting: Orthopaedic Surgery

## 2016-10-17 DIAGNOSIS — E119 Type 2 diabetes mellitus without complications: Secondary | ICD-10-CM | POA: Insufficient documentation

## 2016-10-17 DIAGNOSIS — Z01812 Encounter for preprocedural laboratory examination: Secondary | ICD-10-CM | POA: Diagnosis not present

## 2016-10-17 DIAGNOSIS — Z01810 Encounter for preprocedural cardiovascular examination: Secondary | ICD-10-CM | POA: Diagnosis present

## 2016-10-17 DIAGNOSIS — I10 Essential (primary) hypertension: Secondary | ICD-10-CM | POA: Insufficient documentation

## 2016-10-17 HISTORY — DX: Rheumatic fever without heart involvement: I00

## 2016-10-17 HISTORY — DX: Family history of other specified conditions: Z84.89

## 2016-10-17 HISTORY — DX: Type 2 diabetes mellitus without complications: E11.9

## 2016-10-17 HISTORY — DX: Personal history of other diseases of the digestive system: Z87.19

## 2016-10-17 LAB — CBC
HEMATOCRIT: 39.6 % (ref 36.0–46.0)
HEMOGLOBIN: 13.1 g/dL (ref 12.0–15.0)
MCH: 33.1 pg (ref 26.0–34.0)
MCHC: 33.1 g/dL (ref 30.0–36.0)
MCV: 100 fL (ref 78.0–100.0)
Platelets: 336 10*3/uL (ref 150–400)
RBC: 3.96 MIL/uL (ref 3.87–5.11)
RDW: 12.1 % (ref 11.5–15.5)
WBC: 5.7 10*3/uL (ref 4.0–10.5)

## 2016-10-17 LAB — BASIC METABOLIC PANEL
ANION GAP: 7 (ref 5–15)
BUN: 12 mg/dL (ref 6–20)
CO2: 27 mmol/L (ref 22–32)
Calcium: 8.8 mg/dL — ABNORMAL LOW (ref 8.9–10.3)
Chloride: 104 mmol/L (ref 101–111)
Creatinine, Ser: 0.79 mg/dL (ref 0.44–1.00)
GFR calc Af Amer: >60 mL/min (ref >60)
GFR calc non Af Amer: >60 mL/min (ref >60)
GLUCOSE: 100 mg/dL — AB (ref 65–99)
POTASSIUM: 3.6 mmol/L (ref 3.5–5.1)
Sodium: 138 mmol/L (ref 135–145)

## 2016-10-17 LAB — GLUCOSE, CAPILLARY: Glucose-Capillary: 112 mg/dL — ABNORMAL HIGH (ref 65–99)

## 2016-10-17 LAB — SURGICAL PCR SCREEN
MRSA, PCR: NEGATIVE
Staphylococcus aureus: NEGATIVE

## 2016-10-17 NOTE — Patient Instructions (Addendum)
FELINA TELLO  10/17/2016   Your procedure is scheduled on: 10/26/16  Report to [facility] Main  Entrance Take [place]  elevators to 3rd floor to  Short Stay Center at     1000 AM.     Call this number if you have problems the morning of surgery [phone]    Remember: ONLY 1 PERSON MAY GO WITH YOU TO SHORT STAY TO GET  READY MORNING OF YOUR SURGERY.  Do not eat food or drink liquids :After Midnight.     Take these medicines the morning of surgery with A SIP OF WATER: dexlansoprazole (dexliant)  DO NOT TAKE ANY DIABETIC MEDICATIONS DAY OF YOUR SURGERY                               You may not have any metal on your body including hair pins and              piercings  Do not wear jewelry, make-up, lotions, powders or perfumes, deodorant             Do not wear nail polish.  Do not shave  48 hours prior to surgery.                Do not bring valuables to the hospital. [place] [facility] IS NOT             RESPONSIBLE   FOR VALUABLES.  Contacts, dentures or bridgework may not be worn into surgery.  Leave suitcase in the car. After surgery it may be brought to your room.                  Please read over the following fact sheets you were given: _____________________________________________________________________             [facility] - Preparing for Surgery Before surgery, you can play an important role.  Because skin is not sterile, your skin needs to be as free of germs as possible.  You can reduce the number of germs on your skin by washing with CHG (chlorahexidine gluconate) soap before surgery.  CHG is an antiseptic cleaner which kills germs and bonds with the skin to continue killing germs even after washing. Please DO NOT use if you have an allergy to CHG or antibacterial soaps.  If your skin becomes reddened/irritated stop using the CHG and inform your nurse when you arrive at Short Stay. Do not shave (including legs and underarms) for at least 48  hours prior to the first CHG shower.  You may shave your face/neck. Please follow these instructions carefully:  1.  Shower with CHG Soap the night before surgery and the  morning of Surgery.  2.  If you choose to wash your hair, wash your hair first as usual with your  normal  shampoo.  3.  After you shampoo, rinse your hair and body thoroughly to remove the  shampoo.                           4.  Use CHG as you would any other liquid soap.  You can apply chg directly  to the skin and wash                       Gently  with a scrungie or clean washcloth.  5.  Apply the CHG Soap to your body ONLY FROM THE NECK DOWN.   Do not use on face/ open                           Wound or open sores. Avoid contact with eyes, ears mouth and genitals (private parts).                       Wash face,  Genitals (private parts) with your normal soap.             6.  Wash thoroughly, paying special attention to the area where your surgery  will be performed.  7.  Thoroughly rinse your body with warm water from the neck down.  8.  DO NOT shower/wash with your normal soap after using and rinsing off  the CHG Soap.                9.  Pat yourself dry with a clean towel.            10.  Wear clean pajamas.            11.  Place clean sheets on your bed the night of your first shower and do not  sleep with pets. Day of Surgery : Do not apply any lotions/deodorants the morning of surgery.  Please wear clean clothes to the hospital/surgery center.  FAILURE TO FOLLOW THESE INSTRUCTIONS MAY RESULT IN THE CANCELLATION OF YOUR SURGERY PATIENT SIGNATURE_________________________________  NURSE SIGNATURE__________________________________  ________________________________________________________________________ How to Manage Your Diabetes Before and After Surgery  Why is it important to control my blood sugar before and after surgery? . Improving blood sugar levels before and after surgery helps healing and can limit  problems. . A way of improving blood sugar control is eating a healthy diet by: o  Eating less sugar and carbohydrates o  Increasing activity/exercise o  Talking with your doctor about reaching your blood sugar goals . High blood sugars (greater than 180 mg/dL) can raise your risk of infections and slow your recovery, so you will need to focus on controlling your diabetes during the weeks before surgery. . Make sure that the doctor who takes care of your diabetes knows about your planned surgery including the date and location.  How do I manage my blood sugar before surgery? . Check your blood sugar at least 4 times a day, starting 2 days before surgery, to make sure that the level is not too high or low. o Check your blood sugar the morning of your surgery when you wake up and every 2 hours until you get to the Short Stay unit. . If your blood sugar is less than 70 mg/dL, you will need to treat for low blood sugar: o Do not take insulin. o Treat a low blood sugar (less than 70 mg/dL) with  cup of clear juice (cranberry or apple), 4 glucose tablets, OR glucose gel. o Recheck blood sugar in 15 minutes after treatment (to make sure it is greater than 70 mg/dL). If your blood sugar is not greater than 70 mg/dL on recheck, call [phone] for further instructions. . Report your blood sugar to the short stay nurse when you get to Short Stay.  . If you are admitted to the hospital after surgery: o Your blood sugar will be checked by the staff and you will probably be given  insulin after surgery (instead of oral diabetes medicines) to make sure you have good blood sugar levels. o The goal for blood sugar control after surgery is 80-180 mg/dL.   WHAT DO I DO ABOUT MY DIABETES MEDICATION?  Marland Kitchen Do not take oral diabetes medicines (pills) the morning of surgery.     Patient Signature:  Date:   Nurse Signature:  Date:   Reviewed and Endorsed by [facility] Patient Education Committee, August  2015

## 2016-10-17 NOTE — Progress Notes (Addendum)
Spoke with Dr. Sampson Goon in anesthesia regarding EKG changes. Per Dr. Sampson Goon  With pt. History of DM and HTN she will need to be cleared for surgery by her PCP. Surgery scheduler notified Cordelia Pen Bilings)  By voice message Per Cyndia Diver, RN

## 2016-10-17 NOTE — Progress Notes (Signed)
Please place prders in epic pt. Has a preop appt. 10/17/16 @ 1400. Thank you!

## 2016-10-18 ENCOUNTER — Other Ambulatory Visit (INDEPENDENT_AMBULATORY_CARE_PROVIDER_SITE_OTHER): Payer: Self-pay | Admitting: Orthopaedic Surgery

## 2016-10-18 DIAGNOSIS — M1711 Unilateral primary osteoarthritis, right knee: Secondary | ICD-10-CM

## 2016-10-18 LAB — HEMOGLOBIN A1C
Hgb A1c MFr Bld: 5.4 % (ref 4.8–5.6)
Mean Plasma Glucose: 108 mg/dL

## 2016-10-19 NOTE — Progress Notes (Signed)
Left another voice  mail message regarding  Patient  needing clearance.  [name] and left another message.

## 2016-10-23 NOTE — Progress Notes (Addendum)
Clearance on chart  Medical  Per pcp.

## 2016-10-24 ENCOUNTER — Other Ambulatory Visit (INDEPENDENT_AMBULATORY_CARE_PROVIDER_SITE_OTHER): Payer: Self-pay | Admitting: Orthopaedic Surgery

## 2016-10-25 NOTE — Progress Notes (Signed)
Patient called and [facility] 0925 am on 10/26/2016. Reinforced nothing to eat or drink after midnight. Patient verbalized understanding.

## 2016-10-26 ENCOUNTER — Ambulatory Visit (HOSPITAL_COMMUNITY): Payer: Managed Care, Other (non HMO) | Admitting: Anesthesiology

## 2016-10-26 ENCOUNTER — Encounter (HOSPITAL_COMMUNITY): Payer: Self-pay

## 2016-10-26 ENCOUNTER — Encounter (HOSPITAL_COMMUNITY)
Admission: RE | Disposition: A | Discharge status: Home / Self Care | Payer: Self-pay | Source: Ambulatory Visit | Attending: Orthopaedic Surgery

## 2016-10-26 ENCOUNTER — Observation Stay (HOSPITAL_COMMUNITY)
Admission: RE | Admit: 2016-10-26 | Discharge: 2016-10-27 | Disposition: A | Discharge status: Home / Self Care | Payer: Managed Care, Other (non HMO) | Source: Ambulatory Visit | Attending: Orthopaedic Surgery | Admitting: Orthopaedic Surgery

## 2016-10-26 ENCOUNTER — Observation Stay (HOSPITAL_COMMUNITY): Payer: Managed Care, Other (non HMO)

## 2016-10-26 DIAGNOSIS — Z833 Family history of diabetes mellitus: Secondary | ICD-10-CM | POA: Insufficient documentation

## 2016-10-26 DIAGNOSIS — Z7982 Long term (current) use of aspirin: Secondary | ICD-10-CM | POA: Insufficient documentation

## 2016-10-26 DIAGNOSIS — E119 Type 2 diabetes mellitus without complications: Secondary | ICD-10-CM | POA: Insufficient documentation

## 2016-10-26 DIAGNOSIS — M1711 Unilateral primary osteoarthritis, right knee: Secondary | ICD-10-CM | POA: Diagnosis not present

## 2016-10-26 DIAGNOSIS — Z791 Long term (current) use of non-steroidal anti-inflammatories (NSAID): Secondary | ICD-10-CM | POA: Diagnosis not present

## 2016-10-26 DIAGNOSIS — K219 Gastro-esophageal reflux disease without esophagitis: Secondary | ICD-10-CM | POA: Diagnosis not present

## 2016-10-26 DIAGNOSIS — Z7984 Long term (current) use of oral hypoglycemic drugs: Secondary | ICD-10-CM | POA: Diagnosis not present

## 2016-10-26 DIAGNOSIS — Z79899 Other long term (current) drug therapy: Secondary | ICD-10-CM | POA: Diagnosis not present

## 2016-10-26 DIAGNOSIS — Z8249 Family history of ischemic heart disease and other diseases of the circulatory system: Secondary | ICD-10-CM | POA: Diagnosis not present

## 2016-10-26 DIAGNOSIS — I10 Essential (primary) hypertension: Secondary | ICD-10-CM | POA: Insufficient documentation

## 2016-10-26 DIAGNOSIS — Z800 Family history of malignant neoplasm of digestive organs: Secondary | ICD-10-CM | POA: Diagnosis not present

## 2016-10-26 DIAGNOSIS — F419 Anxiety disorder, unspecified: Secondary | ICD-10-CM | POA: Diagnosis not present

## 2016-10-26 DIAGNOSIS — Z96651 Presence of right artificial knee joint: Secondary | ICD-10-CM

## 2016-10-26 HISTORY — PX: PARTIAL KNEE ARTHROPLASTY: SHX2174

## 2016-10-26 LAB — GLUCOSE, CAPILLARY: GLUCOSE-CAPILLARY: 80 mg/dL (ref 65–99)

## 2016-10-26 SURGERY — ARTHROPLASTY, KNEE, UNICOMPARTMENTAL
Anesthesia: Spinal | Site: Knee | Laterality: Right

## 2016-10-26 MED ORDER — ROPIVACAINE HCL 5 MG/ML IJ SOLN
INTRAMUSCULAR | Status: DC | PRN
  Administered 2016-10-26: 30 mL via PERINEURAL

## 2016-10-26 MED ORDER — METHOCARBAMOL 500 MG PO TABS
500.0000 mg | ORAL_TABLET | Freq: Four times a day (QID) | ORAL | Status: DC | PRN
  Administered 2016-10-26 – 2016-10-27 (×2): 500 mg via ORAL
  Filled 2016-10-26 (×2): qty 1

## 2016-10-26 MED ORDER — METOCLOPRAMIDE HCL 5 MG/ML IJ SOLN: 5.0000 mg | Freq: Three times a day (TID) | INTRAMUSCULAR | Status: DC | PRN

## 2016-10-26 MED ORDER — ONDANSETRON HCL 4 MG/2ML IJ SOLN: 4.0000 mg | Freq: Four times a day (QID) | INTRAMUSCULAR | Status: DC | PRN

## 2016-10-26 MED ORDER — PHENOL 1.4 % MT LIQD: 1.0000 | OROMUCOSAL | Status: DC | PRN

## 2016-10-26 MED ORDER — ACETAMINOPHEN 10 MG/ML IV SOLN
INTRAVENOUS | Status: AC
  Filled 2016-10-26: qty 100

## 2016-10-26 MED ORDER — MENTHOL 3 MG MT LOZG: 1.0000 | LOZENGE | OROMUCOSAL | Status: DC | PRN

## 2016-10-26 MED ORDER — METOCLOPRAMIDE HCL 5 MG PO TABS
5.0000 mg | ORAL_TABLET | Freq: Three times a day (TID) | ORAL | Status: DC | PRN
Start: 2016-10-26 — End: 2016-10-27

## 2016-10-26 MED ORDER — FENTANYL CITRATE (PF) 100 MCG/2ML IJ SOLN
INTRAMUSCULAR | Status: AC
  Filled 2016-10-26: qty 2

## 2016-10-26 MED ORDER — STERILE WATER FOR IRRIGATION IR SOLN
Status: DC | PRN
  Administered 2016-10-26: 2000 mL

## 2016-10-26 MED ORDER — METFORMIN HCL 500 MG PO TABS
500.0000 mg | ORAL_TABLET | Freq: Every day | ORAL | Status: DC
  Administered 2016-10-27: 500 mg via ORAL
  Filled 2016-10-26: qty 1

## 2016-10-26 MED ORDER — ACETAMINOPHEN 650 MG RE SUPP: 650.0000 mg | Freq: Four times a day (QID) | RECTAL | Status: DC | PRN

## 2016-10-26 MED ORDER — DEXAMETHASONE SODIUM PHOSPHATE 10 MG/ML IJ SOLN
INTRAMUSCULAR | Status: AC
  Filled 2016-10-26: qty 1

## 2016-10-26 MED ORDER — ONDANSETRON HCL 4 MG PO TABS: 4.0000 mg | ORAL_TABLET | Freq: Four times a day (QID) | ORAL | Status: DC | PRN

## 2016-10-26 MED ORDER — METHOCARBAMOL 1000 MG/10ML IJ SOLN
500.0000 mg | Freq: Four times a day (QID) | INTRAVENOUS | Status: DC | PRN
  Administered 2016-10-26: 500 mg via INTRAVENOUS
  Filled 2016-10-26: qty 550

## 2016-10-26 MED ORDER — ACETAMINOPHEN 325 MG PO TABS: 650.0000 mg | ORAL_TABLET | Freq: Four times a day (QID) | ORAL | Status: DC | PRN

## 2016-10-26 MED ORDER — OXYCODONE HCL 5 MG PO TABS
5.0000 mg | ORAL_TABLET | ORAL | Status: DC | PRN
  Administered 2016-10-26 (×2): 5 mg via ORAL
  Administered 2016-10-27 (×4): 10 mg via ORAL
  Filled 2016-10-26: qty 1
  Filled 2016-10-26 (×3): qty 2
  Filled 2016-10-26: qty 1
  Filled 2016-10-26 (×2): qty 2

## 2016-10-26 MED ORDER — DOCUSATE SODIUM 100 MG PO CAPS
100.0000 mg | ORAL_CAPSULE | Freq: Two times a day (BID) | ORAL | Status: DC
  Administered 2016-10-26 – 2016-10-27 (×2): 100 mg via ORAL
  Filled 2016-10-26 (×2): qty 1

## 2016-10-26 MED ORDER — FENTANYL CITRATE (PF) 100 MCG/2ML IJ SOLN
INTRAMUSCULAR | Status: DC | PRN
  Administered 2016-10-26: 50 ug via INTRAVENOUS

## 2016-10-26 MED ORDER — ACETAMINOPHEN 10 MG/ML IV SOLN
INTRAVENOUS | Status: DC | PRN
  Administered 2016-10-26: 1000 mg via INTRAVENOUS

## 2016-10-26 MED ORDER — BUPIVACAINE IN DEXTROSE 0.75-8.25 % IT SOLN
INTRATHECAL | Status: DC | PRN
  Administered 2016-10-26: 2 mL via INTRATHECAL

## 2016-10-26 MED ORDER — HYDROMORPHONE HCL 1 MG/ML IJ SOLN
1.0000 mg | INTRAMUSCULAR | Status: DC | PRN
  Administered 2016-10-26: 1 mg via INTRAVENOUS
  Filled 2016-10-26 (×2): qty 1

## 2016-10-26 MED ORDER — PROMETHAZINE HCL 25 MG/ML IJ SOLN: 6.2500 mg | INTRAMUSCULAR | Status: DC | PRN

## 2016-10-26 MED ORDER — CEFAZOLIN SODIUM-DEXTROSE 2-4 GM/100ML-% IV SOLN
2.0000 g | INTRAVENOUS | Status: AC
  Administered 2016-10-26: 2 g via INTRAVENOUS
  Filled 2016-10-26: qty 100

## 2016-10-26 MED ORDER — DIPHENHYDRAMINE HCL 12.5 MG/5ML PO ELIX: 12.5000 mg | ORAL_SOLUTION | ORAL | Status: DC | PRN

## 2016-10-26 MED ORDER — LOSARTAN POTASSIUM 50 MG PO TABS
50.0000 mg | ORAL_TABLET | Freq: Every day | ORAL | Status: DC
  Administered 2016-10-27: 50 mg via ORAL
  Filled 2016-10-26 (×2): qty 1

## 2016-10-26 MED ORDER — LOSARTAN POTASSIUM-HCTZ 50-12.5 MG PO TABS: 1.0000 | ORAL_TABLET | Freq: Every day | ORAL | Status: DC

## 2016-10-26 MED ORDER — PANTOPRAZOLE SODIUM 40 MG PO TBEC
40.0000 mg | DELAYED_RELEASE_TABLET | Freq: Every day | ORAL | Status: DC
  Administered 2016-10-27: 11:00:00 40 mg via ORAL
  Filled 2016-10-26: qty 1

## 2016-10-26 MED ORDER — MIDAZOLAM HCL 5 MG/ML IJ SOLN: 2.0000 mg | Freq: Once | INTRAMUSCULAR | Status: DC

## 2016-10-26 MED ORDER — ONDANSETRON HCL 4 MG/2ML IJ SOLN
INTRAMUSCULAR | Status: AC
  Filled 2016-10-26: qty 2

## 2016-10-26 MED ORDER — CEFAZOLIN SODIUM-DEXTROSE 1-4 GM/50ML-% IV SOLN
1.0000 g | Freq: Four times a day (QID) | INTRAVENOUS | Status: AC
  Administered 2016-10-26 – 2016-10-27 (×2): 1 g via INTRAVENOUS
  Filled 2016-10-26 (×2): qty 50

## 2016-10-26 MED ORDER — KETOROLAC TROMETHAMINE 15 MG/ML IJ SOLN
7.5000 mg | Freq: Four times a day (QID) | INTRAMUSCULAR | Status: AC
  Administered 2016-10-26 – 2016-10-27 (×4): 7.5 mg via INTRAVENOUS
  Filled 2016-10-26 (×4): qty 1

## 2016-10-26 MED ORDER — CANAGLIFLOZIN 100 MG PO TABS
100.0000 mg | ORAL_TABLET | Freq: Every day | ORAL | Status: DC
  Administered 2016-10-27: 09:00:00 100 mg via ORAL
  Filled 2016-10-26: qty 1

## 2016-10-26 MED ORDER — FENTANYL CITRATE (PF) 100 MCG/2ML IJ SOLN
100.0000 ug | Freq: Once | INTRAMUSCULAR | Status: AC
  Administered 2016-10-26: 100 ug via INTRAVENOUS

## 2016-10-26 MED ORDER — CANAGLIFLOZIN-METFORMIN HCL 50-500 MG PO TABS: 1.0000 | ORAL_TABLET | Freq: Every day | ORAL | Status: DC

## 2016-10-26 MED ORDER — LACTATED RINGERS IV SOLN
INTRAVENOUS | Status: DC
  Administered 2016-10-26 (×2): via INTRAVENOUS

## 2016-10-26 MED ORDER — MIDAZOLAM HCL 5 MG/5ML IJ SOLN
INTRAMUSCULAR | Status: DC | PRN
  Administered 2016-10-26: 1 mg via INTRAVENOUS

## 2016-10-26 MED ORDER — PROPOFOL 10 MG/ML IV BOLUS
INTRAVENOUS | Status: AC
  Filled 2016-10-26: qty 60

## 2016-10-26 MED ORDER — MIDAZOLAM HCL 2 MG/2ML IJ SOLN
INTRAMUSCULAR | Status: AC
  Filled 2016-10-26: qty 2

## 2016-10-26 MED ORDER — HYDROCHLOROTHIAZIDE 12.5 MG PO CAPS
12.5000 mg | ORAL_CAPSULE | Freq: Every day | ORAL | Status: DC
  Administered 2016-10-26 – 2016-10-27 (×2): 12.5 mg via ORAL
  Filled 2016-10-26 (×2): qty 1

## 2016-10-26 MED ORDER — ALUM & MAG HYDROXIDE-SIMETH 200-200-20 MG/5ML PO SUSP: 30.0000 mL | ORAL | Status: DC | PRN

## 2016-10-26 MED ORDER — SODIUM CHLORIDE 0.9 % IV SOLN
INTRAVENOUS | Status: DC
  Administered 2016-10-26 – 2016-10-27 (×2): via INTRAVENOUS

## 2016-10-26 MED ORDER — ASPIRIN 81 MG PO CHEW
81.0000 mg | CHEWABLE_TABLET | Freq: Two times a day (BID) | ORAL | Status: DC
  Administered 2016-10-26 – 2016-10-27 (×2): 81 mg via ORAL
  Filled 2016-10-26 (×2): qty 1

## 2016-10-26 MED ORDER — MIDAZOLAM HCL 2 MG/2ML IJ SOLN
INTRAMUSCULAR | Status: AC
  Administered 2016-10-26: 2 mg
  Filled 2016-10-26: qty 2

## 2016-10-26 MED ORDER — HYDROMORPHONE HCL 1 MG/ML IJ SOLN: 0.2500 mg | INTRAMUSCULAR | Status: DC | PRN

## 2016-10-26 MED ORDER — PROPOFOL 500 MG/50ML IV EMUL
INTRAVENOUS | Status: DC | PRN
  Administered 2016-10-26: 100 ug/kg/min via INTRAVENOUS

## 2016-10-26 MED ORDER — PROPOFOL 10 MG/ML IV BOLUS
INTRAVENOUS | Status: AC
  Filled 2016-10-26: qty 20

## 2016-10-26 SURGICAL SUPPLY — 41 items
BAG ZIPLOCK 12X15 (MISCELLANEOUS) IMPLANT
BANDAGE ACE 6X5 VEL STRL LF (GAUZE/BANDAGES/DRESSINGS) ×2 IMPLANT
BENZOIN TINCTURE PRP APPL 2/3 (GAUZE/BANDAGES/DRESSINGS) ×2 IMPLANT
BLADE SAW RECIPROCATING 77.5 (BLADE) ×2 IMPLANT
BLADE SAW SAG 13X.89X90 PERFRM (BLADE) ×2 IMPLANT
BLADE SAW STABLE CUT 109 (BLADE) ×2 IMPLANT
BONE CEMENT PALACOS R-G (Cement) ×2 IMPLANT
BOWL SMART MIX CTS (DISPOSABLE) ×2 IMPLANT
CAPT KNEE PARTIAL 2 ×2 IMPLANT
CEMENT BONE PALACOS R-G (Cement) ×1 IMPLANT
CLOTH BEACON ORANGE TIMEOUT ST (SAFETY) ×2 IMPLANT
COVER SURGICAL LIGHT HANDLE (MISCELLANEOUS) ×2 IMPLANT
CUFF TOURN SGL QUICK 34 (TOURNIQUET CUFF) ×1
CUFF TRNQT CYL 34X4X40X1 (TOURNIQUET CUFF) ×1 IMPLANT
DRAPE U-SHAPE 47X51 STRL (DRAPES) ×2 IMPLANT
DRSG AQUACEL AG ADV 3.5X10 (GAUZE/BANDAGES/DRESSINGS) IMPLANT
DRSG PAD ABDOMINAL 8X10 ST (GAUZE/BANDAGES/DRESSINGS) ×2 IMPLANT
DURAPREP 26ML APPLICATOR (WOUND CARE) ×2 IMPLANT
ELECT REM PT RETURN 15FT ADLT (MISCELLANEOUS) ×2 IMPLANT
GAUZE SPONGE 4X4 12PLY STRL (GAUZE/BANDAGES/DRESSINGS) ×2 IMPLANT
GAUZE XEROFORM 1X8 LF (GAUZE/BANDAGES/DRESSINGS) IMPLANT
GLOVE BIO SURGEON STRL SZ7.5 (GLOVE) ×2 IMPLANT
GLOVE BIOGEL PI IND STRL 8 (GLOVE) ×2 IMPLANT
GLOVE BIOGEL PI INDICATOR 8 (GLOVE) ×2
GLOVE ECLIPSE 8.0 STRL XLNG CF (GLOVE) ×2 IMPLANT
GOWN STRL REUS W/TWL XL LVL3 (GOWN DISPOSABLE) ×4 IMPLANT
HANDPIECE INTERPULSE COAX TIP (DISPOSABLE) ×1
LEGGING LITHOTOMY PAIR STRL (DRAPES) ×2 IMPLANT
MANIFOLD NEPTUNE II (INSTRUMENTS) ×2 IMPLANT
PACK TOTAL KNEE CUSTOM (KITS) ×2 IMPLANT
PADDING CAST COTTON 6X4 STRL (CAST SUPPLIES) ×2 IMPLANT
SET HNDPC FAN SPRY TIP SCT (DISPOSABLE) ×1 IMPLANT
STAPLER VISISTAT 35W (STAPLE) IMPLANT
STRIP CLOSURE SKIN 1/2X4 (GAUZE/BANDAGES/DRESSINGS) ×2 IMPLANT
SUT MNCRL AB 4-0 PS2 18 (SUTURE) IMPLANT
SUT VIC AB 0 CT1 36 (SUTURE) IMPLANT
SUT VIC AB 1 CT1 36 (SUTURE) ×2 IMPLANT
SUT VIC AB 2-0 CT1 27 (SUTURE) ×2
SUT VIC AB 2-0 CT1 TAPERPNT 27 (SUTURE) ×2 IMPLANT
TRAY FOLEY W/METER SILVER 16FR (SET/KITS/TRAYS/PACK) IMPLANT
WRAP KNEE MAXI GEL POST OP (GAUZE/BANDAGES/DRESSINGS) ×2 IMPLANT

## 2016-10-26 NOTE — Brief Op Note (Signed)
10/26/2016  1:11 PM  PATIENT:  Janell QuietKathryn R Koziel  56 y.o. female  PRE-OPERATIVE DIAGNOSIS:  right knee osteoarthritis  POST-OPERATIVE DIAGNOSIS:  right knee osteoarthritis  PROCEDURE:  Procedure(s): RIGHT UNICOMPARTMENTAL KNEE ARTHROPLASTY (Right)  SURGEON:  Surgeon(s) and Role:    Kathryne Hitch* Brita Jurgensen Y, MD - Primary  PHYSICIAN ASSISTANT: Rexene EdisonGil Clark, PA-C  ANESTHESIA:   regional and spinal  EBL:  Total I/O In: 1000 [I.V.:1000] Out: 50 [Blood:50]  COUNTS:  YES  TOURNIQUET:   Total Tourniquet Time Documented: Thigh (Right) - 70 minutes Total: Thigh (Right) - 70 minutes   DICTATION: .Other Dictation: Dictation Number [id number]  PLAN OF CARE: Admit for overnight observation  PATIENT DISPOSITION:  PACU - hemodynamically stable.   Delay start of Pharmacological VTE agent (>24hrs) due to surgical blood loss or risk of bleeding: no

## 2016-10-26 NOTE — Anesthesia Postprocedure Evaluation (Signed)
Anesthesia Post Note  Patient: Deborah [name]  Procedure(s) Performed: Procedure(s) (LRB): RIGHT UNICOMPARTMENTAL KNEE ARTHROPLASTY (Right)  Patient location during evaluation: PACU Anesthesia Type: Spinal Level of consciousness: awake and alert Pain management: pain level controlled Vital Signs Assessment: post-procedure vital signs reviewed and stable Respiratory status: spontaneous breathing and respiratory function stable Cardiovascular status: blood pressure returned to baseline and stable Postop Assessment: spinal receding Anesthetic complications: no       Last Vitals:  Vitals:   10/26/16 1450 10/26/16 1604  BP: (!) 148/108 140/77  Pulse: (!) 54 (!) 58  Resp: 16 16  Temp: 36.6 C 36.5 C    Last Pain:  Vitals:   10/26/16 1604  TempSrc: Axillary  PainSc:                  [name], [name]

## 2016-10-26 NOTE — Progress Notes (Signed)
AssistedDr. Rob Fitzgerald with right, ultrasound guided, adductor canal block. Side rails up, monitors on throughout procedure. See vital signs in flow sheet. Tolerated Procedure well.  

## 2016-10-26 NOTE — Anesthesia Procedure Notes (Signed)
Anesthesia Regional Block: Adductor canal block   Pre-Anesthetic Checklist: ,, timeout performed, Correct Patient, Correct Site, Correct Laterality, Correct Procedure, Correct Position, site marked, Risks and benefits discussed,  Surgical consent,  Pre-op evaluation,  At surgeon's request and post-op pain management  Laterality: Right  Prep: chloraprep       Needles:  Injection technique: Single-shot  Needle Type: Echogenic Needle     Needle Length: 9cm  Needle Gauge: 21     Additional Needles:   Procedures: ultrasound guided,,,,,,,,  Narrative:  Start time: 10/26/2016 10:40 AM End time: 10/26/2016 10:47 AM Injection made incrementally with aspirations every 5 mL.  Performed by: Personally  Anesthesiologist: Marcene DuosFITZGERALD, Tearah Saulsbury

## 2016-10-26 NOTE — Evaluation (Signed)
Physical Therapy Evaluation Patient Details Name: Deborah QuietKathryn R [name] MRN: [id number] DOB: 1960-12-26 Today's Date: 10/26/2016   History of Present Illness  Pt s/p R UKR and with hx of DM and cervical fusion  Clinical Impression  Pt s/p R UKR and presents with decreased R LE strength/ROM and post op pain limiting functional mobility.  Pt should progress to dc home with assist of family/friends and follow up HHPT.    Follow Up Recommendations Home health PT    Equipment Recommendations  Rolling walker with 5" wheels    Recommendations for Other Services OT consult     Precautions / Restrictions Precautions Precautions: Fall;Knee Restrictions Weight Bearing Restrictions: No Other Position/Activity Restrictions: WBAT      Mobility  Bed Mobility Overal bed mobility: Needs Assistance Bed Mobility: Sit to Supine       Sit to supine: Min guard   General bed mobility comments: min cues for sequence and use of L LE to self assist  Transfers Overall transfer level: Needs assistance Equipment used: Rolling walker (2 wheeled) Transfers: Sit to/from Stand Sit to Stand: Min assist;Min guard         General transfer comment: cues for LE management and use of UEs to self assist  Ambulation/Gait Ambulation/Gait assistance: Min assist Ambulation Distance (Feet): 80 Feet Assistive device: Rolling walker (2 wheeled) Gait Pattern/deviations: Step-to pattern;Step-through pattern;Decreased step length - right;Decreased step length - left;Shuffle;Trunk flexed Gait velocity: decr Gait velocity interpretation: Below normal speed for age/gender General Gait Details: cues for posture and position from RW; one episode R knee buckling  Stairs            Wheelchair Mobility    Modified Rankin (Stroke Patients Only)       Balance Overall balance assessment: Needs assistance Sitting-balance support: No upper extremity supported;Feet supported Sitting balance-Leahy Scale: Good     Standing balance support: No upper extremity supported Standing balance-Leahy Scale: Fair                               Pertinent Vitals/Pain Pain Assessment: 0-10 Pain Score: 5  Pain Location: R knee Pain Descriptors / Indicators: Aching;Sore Pain Intervention(s): Limited activity within patient's tolerance;Monitored during session;Premedicated before session    Home Living Family/patient expects to be discharged to:: Private residence Living Arrangements: Alone Available Help at Discharge: Available PRN/intermittently;Available 24 hours/day Type of Home: House Home Access: Stairs to enter Entrance Stairs-Rails: None Entrance Stairs-Number of Steps: 2 Home Layout: One level Home Equipment: Crutches      Prior Function Level of Independence: Independent               Hand Dominance        Extremity/Trunk Assessment   Upper Extremity Assessment Upper Extremity Assessment: Overall WFL for tasks assessed    Lower Extremity Assessment Lower Extremity Assessment: RLE deficits/detail    Cervical / Trunk Assessment Cervical / Trunk Assessment: Normal  Communication   Communication: No difficulties  Cognition Arousal/Alertness: Awake/alert Behavior During Therapy: WFL for tasks assessed/performed Overall Cognitive Status: Within Functional Limits for tasks assessed                                        General Comments      Exercises     Assessment/Plan    PT Assessment Patient needs continued PT services  PT Problem List Decreased strength;Decreased range of motion;Decreased activity tolerance;Decreased mobility;Decreased knowledge of use of DME;Pain       PT Treatment Interventions DME instruction;Gait training;Stair training;Functional mobility training;Therapeutic activities;Therapeutic exercise;Patient/family education    PT Goals (Current goals can be found in the Care Plan section)  Acute Rehab PT Goals Patient  Stated Goal: Regain IND PT Goal Formulation: With patient Time For Goal Achievement: 10/31/16 Potential to Achieve Goals: Good    Frequency 7X/week   Barriers to discharge        Co-evaluation               AM-PAC PT "6 Clicks" Daily Activity  Outcome Measure Difficulty turning over in bed (including adjusting bedclothes, sheets and blankets)?: A Little Difficulty moving from lying on back to sitting on the side of the bed? : A Little Difficulty sitting down on and standing up from a chair with arms (e.g., wheelchair, bedside commode, etc,.)?: A Little Help needed moving to and from a bed to chair (including a wheelchair)?: A Little Help needed walking in hospital room?: A Little Help needed climbing 3-5 steps with a railing? : A Little 6 Click Score: 18    End of Session Equipment Utilized During Treatment: Gait belt Activity Tolerance: Patient tolerated treatment well Patient left: in bed;with call bell/phone within reach Nurse Communication: Mobility status PT Visit Diagnosis: Unsteadiness on feet (R26.81);Difficulty in walking, not elsewhere classified (R26.2)    Time: 1721-1745 PT Time Calculation (min) (ACUTE ONLY): 24 min   Charges:   PT Evaluation $PT Eval Low Complexity: 1 Procedure PT Treatments $Gait Training: 8-22 mins   PT G Codes:        Pg [phone]   Azelyn Batie 10/26/2016, 5:49 PM

## 2016-10-26 NOTE — Anesthesia Procedure Notes (Signed)
Spinal  Patient location during procedure: OR End time: 10/26/2016 11:39 AM Staffing Performed: anesthesiologist  Preanesthetic Checklist Completed: patient identified, site marked, surgical consent, pre-op evaluation, timeout performed, IV checked, risks and benefits discussed and monitors and equipment checked Spinal Block Patient position: sitting Prep: ChloraPrep Patient monitoring: heart rate, continuous pulse ox and blood pressure Approach: midline Location: L3-4 Injection technique: single-shot Needle Needle type: Pencan  Needle gauge: 24 G Needle length: 9 cm Additional Notes Expiration date of kit checked and confirmed. Patient tolerated procedure well, without complications.

## 2016-10-26 NOTE — H&P (Signed)
TOTAL KNEE ADMISSION H&P  Patient is being admitted for right partial knee arthroplasty.  Subjective:  Chief Complaint:right knee pain.  HPI: Deborah [name], 56 y.o. female, has a history of pain and functional disability in the right knee due to arthritis and has failed non-surgical conservative treatments for greater than 12 weeks to includeNSAID's and/or analgesics, corticosteriod injections, viscosupplementation injections, flexibility and strengthening excercises and activity modification.  Onset of symptoms was gradual, starting 5 years ago with gradually worsening course since that time. The patient noted prior procedures on the knee to include  arthroscopy and menisectomy on the right knee(s).  Patient currently rates pain in the right knee(s) at 10 out of 10 with activity. Patient has night pain, worsening of pain with activity and weight bearing, pain that interferes with activities of daily living, pain with passive range of motion, crepitus and joint swelling.  Patient has evidence of subchondral sclerosis, periarticular osteophytes and joint space narrowing by imaging studies. There is no active infection.  Patient Active Problem List   Diagnosis Date Noted  . Primary osteoarthritis of right knee 08/21/2016  . IFG (impaired fasting glucose) 02/01/2014  . Flushing 02/01/2014  . GERD (gastroesophageal reflux disease) 12/20/2012  . Anxiety state, unspecified 12/20/2012  . Essential hypertension, benign 12/20/2012  . Impaired fasting glucose 12/20/2012   Past Medical History:  Diagnosis Date  . Diabetes mellitus without complication (HCC)    type 2  . Family history of adverse reaction to anesthesia    father had naseau and vomiting  . GERD (gastroesophageal reflux disease)   . History of hiatal hernia   . Hypertension   . Impaired fasting glucose   . Rheumatic fever    2 valves that leak     Past Surgical History:  Procedure Laterality Date  . CERVICAL FUSION     C6  .  CYST REMOVAL HAND  2010   Left Wrist  . FOOT SURGERY    . KNEE SURGERY  2009   Right Knee    Prescriptions Prior to Admission  Medication Sig Dispense Refill Last Dose  . acetaminophen (TYLENOL) 500 MG tablet Take 1,000 mg by mouth every 6 (six) hours as needed (for pain).   09/16/2016  . dexlansoprazole (DEXILANT) 60 MG capsule Take 1 capsule (60 mg total) by mouth daily. 30 capsule 11 10/26/2016 at 0600  . ibuprofen (ADVIL,MOTRIN) 200 MG tablet Take 800 mg by mouth every 8 (eight) hours as needed (for pain.).   10/25/2016 at Unknown time  . INVOKAMET 50-500 MG TABS Take 1 tablet by mouth daily.    10/25/2016 at Unknown time  . losartan-hydrochlorothiazide (HYZAAR) 50-12.5 MG per tablet Take 1 tablet by mouth daily. 30 tablet 11 10/25/2016 at Unknown time   Allergies  Allergen Reactions  . Latex Itching    Social History  Substance Use Topics  . Smoking status: Never Smoker  . Smokeless tobacco: Never Used  . Alcohol use 18.6 oz/week    3 Cans of beer, 28 Shots of liquor per week    Family History  Problem Relation Age of Onset  . Schizophrenia Mother   . Diabetes Mother        Type II  . Heart disease Father   . Hypertension Father   . Hyperlipidemia Father   . Heart disease Brother   . Hypertension Brother   . Cancer Brother        Colon Cancer  . CVA Maternal Aunt   . CVA Maternal  Grandmother   . Heart disease Paternal Grandfather      Review of Systems  Musculoskeletal: Positive for joint pain.  All other systems reviewed and are negative.   Objective:  Physical Exam  Constitutional: She is oriented to person, place, and time. She appears well-developed and well-nourished.  HENT:  Head: Normocephalic and atraumatic.  Eyes: EOM are normal. Pupils are equal, round, and reactive to light.  Neck: Normal range of motion. Neck supple.  Cardiovascular: Normal rate and regular rhythm.   Respiratory: Effort normal and breath sounds normal.  GI: Soft. Bowel sounds are  normal.  Musculoskeletal:       Right knee: She exhibits decreased range of motion, effusion and abnormal alignment. Tenderness found. Medial joint line tenderness noted.  Neurological: She is alert and oriented to person, place, and time.  Skin: Skin is warm and dry.  Psychiatric: She has a normal mood and affect.    Vital signs in last 24 hours: Temp:  [98.6 F (37 C)] 98.6 F (37 C) (05/11 0915) Pulse Rate:  [78] 78 (05/11 0915) Resp:  [16] 16 (05/11 0915) BP: (139)/(93) 139/93 (05/11 0915) SpO2:  [99 %] 99 % (05/11 0915) Weight:  [160 lb (72.6 kg)] 160 lb (72.6 kg) (05/11 0943)  Labs:   Estimated body mass index is 29.26 kg/m as calculated from the following:   Height as of this encounter: 5\' 2"  (1.575 m).   Weight as of this encounter: 160 lb (72.6 kg).   Imaging Review Plain radiographs demonstrate severe degenerative joint disease of the right knee(s). The overall alignment ismild varus. The bone quality appears to be excellent for age and reported activity level.  Assessment/Plan:  End stage arthritis, right knee medial comartment  The patient history, physical examination, clinical judgment of the provider and imaging studies are consistent with end stage degenerative joint disease of the right knee(s) and partial knee arthroplasty is deemed medically necessary. The treatment options including medical management, injection therapy arthroscopy and arthroplasty were discussed at length. The risks and benefits of partial knee arthroplasty were presented and reviewed. The risks due to aseptic loosening, infection, stiffness, patella tracking problems, thromboembolic complications and other imponderables were discussed. The patient acknowledged the explanation, agreed to proceed with the plan and consent was signed. Patient is being admitted for inpatient treatment for surgery, pain control, PT, OT, prophylactic antibiotics, VTE prophylaxis, progressive ambulation and ADL's and  discharge planning. The patient is planning to be discharged home with home health services

## 2016-10-26 NOTE — Transfer of Care (Signed)
Immediate Anesthesia Transfer of Care Note  Patient: Deborah [name]  Procedure(s) Performed: Procedure(s): RIGHT UNICOMPARTMENTAL KNEE ARTHROPLASTY (Right)  Patient Location: PACU  Anesthesia Type:Spinal  Level of Consciousness: awake, alert , oriented and patient cooperative  Airway & Oxygen Therapy: Patient Spontanous Breathing and Patient connected to face mask oxygen  Post-op Assessment: Report given to RN and Post -op Vital signs reviewed and stable  Post vital signs: stable  Last Vitals:  Vitals:   10/26/16 1118 10/26/16 1119  BP:  136/86  Pulse: 71 64  Resp: (!) 21 14  Temp:      Last Pain:  Vitals:   10/26/16 0942  TempSrc:   PainSc: 0-No pain         Complications: No apparent anesthesia complications

## 2016-10-26 NOTE — Anesthesia Preprocedure Evaluation (Addendum)
Anesthesia Evaluation  Patient identified by MRN, date of birth, ID band Patient awake    Reviewed: Allergy & Precautions, NPO status , Patient's Chart, lab work & pertinent test results  Airway Mallampati: II  TM Distance: >3 FB Neck ROM: Full    Dental   Pulmonary neg pulmonary ROS,    breath sounds clear to auscultation- rhonchi       Cardiovascular hypertension, Pt. on medications (-) angina(-) DOE  Rhythm:Regular Rate:Normal     Neuro/Psych negative neurological ROS     GI/Hepatic Neg liver ROS, hiatal hernia, GERD  ,  Endo/Other  diabetes, Type 2  Renal/GU negative Renal ROS     Musculoskeletal  (+) Arthritis ,   Abdominal   Peds  Hematology negative hematology ROS (+)   Anesthesia Other Findings   Reproductive/Obstetrics                            Lab Results  Component Value Date   WBC 5.7 10/17/2016   HGB 13.1 10/17/2016   HCT 39.6 10/17/2016   MCV 100.0 10/17/2016   PLT 336 10/17/2016   Lab Results  Component Value Date   CREATININE 0.79 10/17/2016   BUN 12 10/17/2016   NA 138 10/17/2016   K 3.6 10/17/2016   CL 104 10/17/2016   CO2 27 10/17/2016   No results found for: INR, PROTIME  Anesthesia Physical Anesthesia Plan  ASA: II  Anesthesia Plan: Spinal   Post-op Pain Management:  Regional for Post-op pain   Induction: Intravenous  Airway Management Planned: Natural Airway and Simple Face Mask  Additional Equipment:   Intra-op Plan:   Post-operative Plan:   Informed Consent: I have reviewed the patients History and Physical, chart, labs and discussed the procedure including the risks, benefits and alternatives for the proposed anesthesia with the patient or authorized representative who has indicated his/her understanding and acceptance.     Plan Discussed with:   Anesthesia Plan Comments:         Anesthesia Quick Evaluation

## 2016-10-27 DIAGNOSIS — M1711 Unilateral primary osteoarthritis, right knee: Secondary | ICD-10-CM | POA: Diagnosis not present

## 2016-10-27 MED ORDER — OXYCODONE-ACETAMINOPHEN 5-325 MG PO TABS: 1.0000 | ORAL_TABLET | ORAL | 0 refills | Status: DC | PRN

## 2016-10-27 MED ORDER — METHOCARBAMOL 500 MG PO TABS: 500.0000 mg | ORAL_TABLET | Freq: Four times a day (QID) | ORAL | 0 refills | Status: DC | PRN

## 2016-10-27 MED ORDER — ASPIRIN 81 MG PO CHEW: 81.0000 mg | CHEWABLE_TABLET | Freq: Two times a day (BID) | ORAL | 0 refills | Status: DC

## 2016-10-27 NOTE — Discharge Instructions (Signed)

## 2016-10-27 NOTE — Discharge Summary (Signed)
Patient ID: Deborah Deborah [name] MRN: [id number] DOB/AGE: 10/12/1960 56 y.o.  Admit date: 10/26/2016 Discharge date: 10/27/2016  Admission Diagnoses:  Principal Problem:   Primary osteoarthritis of right knee Active Problems:   Status post right unicompartmental knee replacement   Discharge Diagnoses:  Same  Past Medical History:  Diagnosis Date  . Diabetes mellitus without complication (HCC)    type 2  . Family history of adverse reaction to anesthesia    father had naseau and vomiting  . GERD (gastroesophageal reflux disease)   . History of hiatal hernia   . Hypertension   . Impaired fasting glucose   . Rheumatic fever    2 valves that leak     Surgeries: Procedure(s): RIGHT UNICOMPARTMENTAL KNEE ARTHROPLASTY on 10/26/2016   Consultants:   Discharged Condition: Improved  Hospital Course: Deborah Deborah [name] is an 56 y.o. female who was admitted 10/26/2016 for operative treatment ofPrimary osteoarthritis of right knee. Patient has severe unremitting Deborah [name] that affects sleep, daily activities, and work/hobbies. After pre-op clearance the patient was taken to the operating room on 10/26/2016 and underwent  Procedure(s): RIGHT UNICOMPARTMENTAL KNEE ARTHROPLASTY.    Patient was given perioperative antibiotics: Anti-infectives    Start     Dose/Rate Route Frequency Ordered Stop   10/26/16 1700  ceFAZolin (ANCEF) IVPB 1 g/50 mL premix     1 g 100 mL/hr over 30 Minutes Intravenous Every 6 hours 10/26/16 1510 10/27/16 0030   10/26/16 0910  ceFAZolin (ANCEF) IVPB 2g/100 mL premix     2 g 200 mL/hr over 30 Minutes Intravenous On call to O.R. 10/26/16 0910 10/26/16 1145       Patient was given sequential compression devices, early ambulation, and chemoprophylaxis to prevent DVT.  Patient benefited maximally from hospital stay and there were no complications.    Recent vital signs: Patient Vitals for the past 24 hrs:  BP Temp Temp src Pulse Resp SpO2  10/27/16 0540 129/75 98.6 F  (37 C) Oral 60 18 99 %  10/27/16 0207 109/71 98.5 F (36.9 C) Oral (!) 57 18 99 %  10/26/16 2105 131/70 98.3 F (36.8 C) Oral 66 18 99 %  10/26/16 1821 119/67 98 F (36.7 C) Oral (!) 58 20 97 %  10/26/16 1706 133/68 97.6 F (36.4 C) Axillary 62 18 100 %  10/26/16 1604 140/77 97.7 F (36.5 C) Axillary (!) 58 16 98 %  10/26/16 1450 (!) 148/108 97.9 F (36.6 C) - (!) 54 16 97 %  10/26/16 1430 (!) 142/103 97.3 F (36.3 C) - (!) 56 14 100 %  10/26/16 1415 (!) 128/99 - - (!) 47 15 100 %  10/26/16 1400 138/90 - - (!) 55 18 100 %  10/26/16 1345 134/71 - - (!) 52 14 100 %  10/26/16 1330 116/81 - - (!) 54 15 99 %  10/26/16 1319 122/80 98.4 F (36.9 C) - (!) 58 16 100 %  10/26/16 1119 136/86 - - 64 14 100 %  10/26/16 1118 - - - 71 (!) 21 100 %  10/26/16 1117 - - - 66 15 100 %  10/26/16 1116 - - - 63 15 100 %  10/26/16 1115 - - - 64 17 99 %  10/26/16 1114 - - - 65 19 99 %  10/26/16 1113 - - - 63 18 100 %  10/26/16 1112 - - - (!) 59 18 100 %  10/26/16 1111 - - - 65 16 100 %  10/26/16 1110 - - - 69 (!)  23 100 %  10/26/16 1109 - - - 69 20 100 %  10/26/16 1108 - - - 64 17 100 %  10/26/16 1107 - - - 60 18 100 %  10/26/16 1106 - - - 67 18 100 %  10/26/16 1105 - - - 64 16 100 %  10/26/16 1104 127/77 - - 67 20 100 %  10/26/16 1103 - - - 64 12 100 %  10/26/16 1102 - - - 67 - 99 %  10/26/16 1101 - - - 66 - 100 %  10/26/16 1100 - - - 64 - 100 %  10/26/16 1059 116/71 - - 65 - 100 %  10/26/16 1058 - - - 63 - 99 %  10/26/16 1057 - - - 68 - 99 %  10/26/16 1056 - - - 67 - 99 %  10/26/16 1055 - - - 61 - 100 %  10/26/16 1054 122/76 - - 66 16 98 %  10/26/16 1053 - - - 67 - 98 %  10/26/16 1052 - - - 73 - 99 %  10/26/16 1051 - - - 66 - 97 %  10/26/16 1050 - - - 69 - 98 %  10/26/16 1049 135/81 - - 68 14 99 %  10/26/16 1048 - - - 68 - 100 %  10/26/16 1047 - - - 71 - 100 %  10/26/16 1046 - - - 74 - 99 %  10/26/16 1045 - - - 80 - 99 %  10/26/16 1044 140/81 - - 76 16 97 %  10/26/16 1043 - - - 80  - 98 %  10/26/16 1042 - - - 77 - 100 %  10/26/16 1041 - - - 86 - 100 %  10/26/16 1040 - - - 87 - 100 %  10/26/16 1039 (!) 143/83 - - 79 16 100 %  10/26/16 1038 - - - 79 - 100 %  10/26/16 1037 - - - 85 - 100 %  10/26/16 1036 - - - 67 - 100 %  10/26/16 1035 - - - 69 - 100 %  10/26/16 1034 (!) 122/106 - - 69 16 99 %  10/26/16 1033 - - - 75 - 99 %  10/26/16 1032 - - - 67 - 99 %  10/26/16 1031 - - - 76 - 98 %  10/26/16 1030 - - - 68 - 100 %  10/26/16 1029 - - - 77 - 99 %  10/26/16 1028 - - - 74 - 98 %  10/26/16 1027 - - - 71 - 98 %  10/26/16 1026 - - - 73 - 97 %  10/26/16 1025 - - - 72 - 97 %  10/26/16 1024 123/80 - - 68 - 97 %     Recent laboratory studies: No results for input(s): WBC, HGB, HCT, PLT, NA, K, CL, CO2, BUN, CREATININE, GLUCOSE, INR, CALCIUM in the last 72 hours.  Invalid input(s): PT, 2   Discharge Medications:   Allergies as of 10/27/2016      Reactions   Latex Itching      Medication List    TAKE these medications   acetaminophen 500 MG tablet Commonly known as:  TYLENOL Take 1,000 mg by mouth every 6 (six) hours as needed (for Deborah [name]).   aspirin 81 MG chewable tablet Chew 1 tablet (81 mg total) by mouth 2 (two) times daily.   dexlansoprazole 60 MG capsule Commonly known as:  DEXILANT Take 1 capsule (60 mg total) by mouth daily.   ibuprofen 200  MG tablet Commonly known as:  ADVIL,MOTRIN Take 800 mg by mouth every 8 (eight) hours as needed (for Deborah [name].).   INVOKAMET 50-500 MG Tabs Generic drug:  Canagliflozin-Metformin HCl Take 1 tablet by mouth daily.   losartan-hydrochlorothiazide 50-12.5 MG tablet Commonly known as:  HYZAAR Take 1 tablet by mouth daily.   methocarbamol 500 MG tablet Commonly known as:  ROBAXIN Take 1 tablet (500 mg total) by mouth every 6 (six) hours as needed for muscle spasms.   oxyCODONE-acetaminophen 5-325 MG tablet Commonly known as:  ROXICET Take 1-2 tablets by mouth every 4 (four) hours as needed.             Durable Medical Equipment        Start     Ordered   10/26/16 1511  DME Walker rolling  Once    Question:  Patient needs a walker to treat with the following condition  Answer:  Status post right unicompartmental knee replacement   10/26/16 1510      Diagnostic Studies: Dg Knee Right Port  Result Date: 10/26/2016 CLINICAL DATA:  Status post hemiarthroplasty EXAM: PORTABLE RIGHT KNEE - 1-2 VIEW COMPARISON:  MRI a 09/21/2006 FINDINGS: Medial compartment knee prosthesis is noted. The femoral and tibial components appear well seated without complicating features. IMPRESSION: Well seated components of a medial knee compartment hemiarthroplasty. No complicating features. Electronically Signed   By: Rudie MeyerP.  Gallerani M.D.   On: 10/26/2016 14:07    Disposition: to home  Discharge Instructions    Discharge patient    Complete by:  As directed    Discharge disposition:  01-Home or Self Care   Discharge patient date:  10/27/2016      Follow-up Information    Home, [facility] Follow up.   Specialty:  Home Health Services Why:  Home Health Physical Therapy Contact information: [address] [place] [address] [place] [address] [phone]        Kathryne HitchBlackman, Saron Vanorman Y, MD Follow up in 2 week(s).   Specialty:  Orthopedic Surgery Contact information: [address] [place] [address] [phone]            Signed: Kathryne HitchChristopher Y Ronasia Isola 10/27/2016, 9:43 AM

## 2016-10-27 NOTE — Progress Notes (Signed)
Physical Therapy Treatment Patient Details Name: Deborah [name] MRN: [id number] DOB: 03/22/1961 Today's Date: 10/27/2016    History of Present Illness Pt s/p R UKR and with hx of DM and cervical fusion    PT Comments    Ready for DC    Follow Up Recommendations  Home health PT     Equipment Recommendations   (will borrow one, has crutches)    Recommendations for Other Services       Precautions / Restrictions Precautions Precautions: Fall;Knee    Mobility  Bed Mobility Overal bed mobility: Independent                Transfers Overall transfer level: Modified independent Equipment used: Rolling walker (2 wheeled)                Ambulation/Gait Ambulation/Gait assistance: Supervision Ambulation Distance (Feet): 50 Feet Assistive device: Rolling walker (2 wheeled) Gait Pattern/deviations: Step-to pattern;Antalgic Gait velocity: decr   General Gait Details: cues for posture and position from RW; one episode R knee buckling   Stairs Stairs: Yes   Stair Management: No rails;Step to pattern;Forwards;With crutches Number of Stairs: 2 General stair comments: practiced x 2  Wheelchair Mobility    Modified Rankin (Stroke Patients Only)       Balance                                            Cognition Arousal/Alertness: Awake/alert                                            Exercises Total Joint Exercises Quad Sets: AROM;Both;10 reps Heel Slides: AROM;Right;10 reps Hip ABduction/ADduction: AROM;Right;10 reps Straight Leg Raises: AROM;Right;10 reps Long Arc Quad: AROM;Right;10 reps Knee Flexion: AROM;Right;10 reps    General Comments        Pertinent Vitals/Pain Pain Score: 8  Pain Location: R knee Pain Descriptors / Indicators: Aching;Sore Pain Intervention(s): Monitored during session;Repositioned;Patient requesting pain meds-RN notified    Home Living                      Prior  Function            PT Goals (current goals can now be found in the care plan section) Progress towards PT goals: Progressing toward goals    Frequency           PT Plan Current plan remains appropriate    Co-evaluation              AM-PAC PT "6 Clicks" Daily Activity  Outcome Measure  Difficulty turning over in bed (including adjusting bedclothes, sheets and blankets)?: None Difficulty moving from lying on back to sitting on the side of the bed? : None Difficulty sitting down on and standing up from a chair with arms (e.g., wheelchair, bedside commode, etc,.)?: None Help needed moving to and from a bed to chair (including a wheelchair)?: None Help needed walking in hospital room?: A Little Help needed climbing 3-5 steps with a railing? : A Little 6 Click Score: 22    End of Session   Activity Tolerance: Patient tolerated treatment well Patient left: in chair Nurse Communication: Mobility status       Time: 4098-1191 PT Time Calculation (  min) (ACUTE ONLY): 30 min  Charges:  $Gait Training: 8-22 mins $Therapeutic Exercise: 8-22 mins                    G CodesBlanchard Kelch:       Juan Olthoff PT [phone]   Rada HayHill, Reyah Streeter Elizabeth 10/27/2016, 1:54 PM

## 2016-10-27 NOTE — Progress Notes (Signed)
Discharged from floor via w/c for transport home by car. Belongings & friend with pt. No changes in assessment. Kazuma Elena  

## 2016-10-27 NOTE — Progress Notes (Signed)
Patient ID: Deborah [name], female   DOB: Aug 12, 1960, 56 y.o.   MRN: [id number] Doing well.  Right knee stable.  Vitals stable.  Can be discharged to home today.

## 2016-10-29 ENCOUNTER — Encounter (HOSPITAL_COMMUNITY): Payer: Self-pay | Admitting: Orthopaedic Surgery

## 2016-10-29 NOTE — Op Note (Signed)
NAME:  Deborah [name], Deborah [name]                    ACCOUNT NO.:  MEDICAL RECORD NO.:  [id number]  LOCATION:                                 FACILITY:  PHYSICIAN:  Deborah [name], Deborah GuerraDATE OF BIRTH:  DATE OF PROCEDURE:  10/26/2016 DATE OF DISCHARGE:                              OPERATIVE REPORT   PREOPERATIVE DIAGNOSIS:  Right knee medial compartment severe osteoarthritis.  POSTOPERATIVE DIAGNOSIS:  Right knee medial compartment severe osteoarthritis.  PROCEDURE:  Right unicompartmental/partial knee arthroplasty.  IMPLANTS:  Biomet Oxford unicompartmental knee with a small femur, size B right tibial tray, 4 mm mobile bearing polyethylene insert.  SURGEON:  Deborah [name], M.D.  ASSISTANT:  Richardean Canal, PA-C.  ANESTHESIA: 1. Right lower extremity adductor canal block. 2. Spinal.  ANTIBIOTICS:  2 g of IV Ancef.  BLOOD LOSS:  Less than 100 mL.  TOURNIQUET TIME:  Less than 1 hour.  COMPLICATIONS:  None.  INDICATIONS:  Deborah [name] is a 56 year old, well known to me.  I have seen her for many years now and she has debilitating medial compartment arthritis of her right knee.  She has tried and failed all forms of conservative treatment and even years ago, had arthroscopic intervention of that right knee due to a meniscal tear.  Her ACL, PCL are intact and in the lateral compartment of patellofemoral joint, I have noted significant arthritic changes.  Her pain is daily and it has detrimentally affected her activities of daily living, her quality of life, and her mobility. At this point, we have recommended a partial knee arthroplasty.  She does wish to proceed with this.  She understands the risk of acute blood loss anemia, nerve and vessel injury, fracture, infection, and DVT.  She understands our goals are to decrease pain, improve mobility, and overall improve quality of life.  PROCEDURE DESCRIPTION:  After informed consent was obtained, appropriate right knee was  marked.  Anesthesia obtained with adductor canal block in the holding area.  She was then brought to the operating room where a spinal anesthesia was obtained while she was on her stretcher.  She was then placed supine on the operating table.  Her left leg was placed in a well leg holder and the right leg had a holder placed in the proximal, then the popliteal area.  A tourniquet was placed around her right upper thigh as well.  Her right leg was then prepped and draped from the thigh down the toes with DuraPrep and sterile drapes including a sterile stockinette with the bed raised and the right knee flexed.  A time-out was called and she was identified as correct patient and correct right knee.  We then used Esmarch to wrap out the leg and tourniquet was inflated to 300 mmHg.  I then made an incision just medial to the patella and carried this to the patellar tendon.  We dissected down the knee joint and carried out a medial parapatellar arthrotomy.  We found a large joint effusion and found significant cartilage wear and areas of full-thickness on the medial femoral condyle and the medial tibial plateau.  We removed remnants of the medial meniscus.  I found  the ACL and PCL to be intact as well as the lateral compartment and the patellofemoral compartment.  We then used a sizing spoon and shows a small femur.  Based off a small femur sizing spoon which set our alignment for the tibial tray and corrected for a neutral slope and based off the femur and the tibia, we were able to make our tibial cut with the reciprocating and the sagittal saw.  We then, based on her tibia, chose a size B right tibia and I was pleased with the fit of the tibial plateau with that.  Attention was then turned back to the femur. Using an intramedullary guide through the femoral notch area, we were able to assess the rotation of the femur and get a midline area of the femur.  We drilled holes for this and then we  were able to use just a size 0 spigot to mill.  We did also, prior to this, do our distal femoral cut based off a small femur as well.  With the small femur trial followed by the medium B tibia, we worked on our flexion and extension balancing.  Once so, we made several passes with different sizing spoons and polyethylene, we were able to mill down to a size 2 on the femur. This balanced with the size 4 polyethylene insert from flexion and extension gaps and equaled these.  We then went back to the tibia and made our keel cut for the size B tibia.  We then mixed our cement.  We removed all debris from the knee.  We cemented our real Biomet size right B tibial tray followed by the real size small femur.  We previously put some extra drill holes in the femur.  We held the knee compress in a 45-degree position and once the cement had hardened, we removed other cement debris from the knee and then placed our real polyethylene 4 mm mobile bearing insert.  We were pleased with the range of motion and stability of the knee after that.  We then irrigated the knee with normal saline solution using pulsatile lavage.  We were able to close our arthrotomy with interrupted #1 Vicryl suture followed by 0 Vicryl in the deep tissue, 0 Vicryl in the subcutaneous tissue, 4-0 Monocryl subcuticular stitch and Steri-Strips on the skin.  Well-padded sterile dressing was applied and she was taken off the operating table and taken to the recovery room in stable condition.  All final counts were correct.  There were no complications noted.     Deborah Pandahristopher Y. Magnus IvanBlackman, M.D.     CYB/MEDQ  D:  10/26/2016  T:  10/26/2016  Job:  [id number]

## 2016-11-01 NOTE — Progress Notes (Signed)
   10/26/16 1700  PT Time Calculation  PT Start Time (ACUTE ONLY) 1721  PT Stop Time (ACUTE ONLY) 1745  PT Time Calculation (min) (ACUTE ONLY) 24 min  PT G-Codes **NOT FOR INPATIENT CLASS**  Functional Assessment Tool Used Clinical judgement  Functional Limitation Mobility: Walking and moving around  Mobility: Walking and Moving Around Current Status (Z6109(G8978) CJ  Mobility: Walking and Moving Around Goal Status (U0454(G8979) CI  PT General Charges  $$ ACUTE PT VISIT 1 Procedure  PT Evaluation  $PT Eval Low Complexity 1 Procedure  PT Treatments  $Gait Training 8-22 mins

## 2016-11-02 ENCOUNTER — Other Ambulatory Visit (HOSPITAL_COMMUNITY): Payer: Self-pay | Admitting: Orthopaedic Surgery

## 2016-11-02 ENCOUNTER — Encounter (INDEPENDENT_AMBULATORY_CARE_PROVIDER_SITE_OTHER): Payer: Self-pay | Admitting: Orthopaedic Surgery

## 2016-11-03 NOTE — Telephone Encounter (Signed)
Hold for blackman for monday

## 2016-11-05 ENCOUNTER — Other Ambulatory Visit (INDEPENDENT_AMBULATORY_CARE_PROVIDER_SITE_OTHER): Payer: Self-pay | Admitting: Orthopaedic Surgery

## 2016-11-05 MED ORDER — OXYCODONE-ACETAMINOPHEN 5-325 MG PO TABS: 1.0000 | ORAL_TABLET | Freq: Four times a day (QID) | ORAL | 0 refills | Status: DC | PRN

## 2016-11-08 ENCOUNTER — Ambulatory Visit (INDEPENDENT_AMBULATORY_CARE_PROVIDER_SITE_OTHER): Payer: Managed Care, Other (non HMO) | Admitting: Orthopaedic Surgery

## 2016-11-08 ENCOUNTER — Other Ambulatory Visit (INDEPENDENT_AMBULATORY_CARE_PROVIDER_SITE_OTHER): Payer: Self-pay

## 2016-11-08 ENCOUNTER — Other Ambulatory Visit (HOSPITAL_COMMUNITY): Payer: Self-pay | Admitting: Orthopaedic Surgery

## 2016-11-08 DIAGNOSIS — Z96651 Presence of right artificial knee joint: Secondary | ICD-10-CM

## 2016-11-08 NOTE — Progress Notes (Signed)
The patient is 2 weeks status post a partial knee replacement of the medial compartment of her right knee. She is doing well. She is walking without assistive device. She says she didn't really to get outpatient therapy. She is not anxious to get back working at which I agree with she does a lot of heavy work.  On examination her knee incision looks great. I placed knee Steri-Strips. The notes infection. The knee feels ligamentously stable the right knee. There is some mild swelling. Her extension is full flexion is about 95.  This point she'll continue increase her activities and we'll see her back in a month to see how she doing overall to release her work. We will not need x-rays of the next visit. She is doing well from pain medicine standpoint but may run out over the next month and if she needs some she knows to call us with can refill them.

## 2016-11-09 ENCOUNTER — Other Ambulatory Visit (INDEPENDENT_AMBULATORY_CARE_PROVIDER_SITE_OTHER): Payer: Self-pay | Admitting: Family

## 2016-11-09 ENCOUNTER — Other Ambulatory Visit (INDEPENDENT_AMBULATORY_CARE_PROVIDER_SITE_OTHER): Payer: Self-pay | Admitting: Orthopaedic Surgery

## 2016-11-09 MED ORDER — OXYCODONE-ACETAMINOPHEN 5-325 MG PO TABS: 1.0000 | ORAL_TABLET | Freq: Four times a day (QID) | ORAL | 0 refills | Status: DC | PRN

## 2016-11-09 MED ORDER — METHOCARBAMOL 500 MG PO TABS: 500.0000 mg | ORAL_TABLET | Freq: Four times a day (QID) | ORAL | 0 refills | Status: DC

## 2016-11-09 NOTE — Telephone Encounter (Signed)
I called patient to advise script for pain medication up front for pick up. She also requests refill of muscle relaxer.   I spoke with Barnie DelErin Zamora, NP who ok'd muscle relaxer. Entered and sent to pharmacy.

## 2016-11-16 ENCOUNTER — Other Ambulatory Visit (INDEPENDENT_AMBULATORY_CARE_PROVIDER_SITE_OTHER): Payer: Self-pay | Admitting: Family

## 2016-11-19 ENCOUNTER — Other Ambulatory Visit (INDEPENDENT_AMBULATORY_CARE_PROVIDER_SITE_OTHER): Payer: Self-pay | Admitting: Family

## 2016-11-19 ENCOUNTER — Encounter (INDEPENDENT_AMBULATORY_CARE_PROVIDER_SITE_OTHER): Payer: Self-pay | Admitting: Orthopaedic Surgery

## 2016-11-19 MED ORDER — OXYCODONE-ACETAMINOPHEN 5-325 MG PO TABS: 1.0000 | ORAL_TABLET | Freq: Four times a day (QID) | ORAL | 0 refills | Status: DC | PRN

## 2016-11-19 MED ORDER — OXYCODONE-ACETAMINOPHEN 5-325 MG PO TABS: 1.0000 | ORAL_TABLET | Freq: Three times a day (TID) | ORAL | 0 refills | Status: DC | PRN

## 2016-11-20 ENCOUNTER — Encounter (INDEPENDENT_AMBULATORY_CARE_PROVIDER_SITE_OTHER): Payer: Self-pay | Admitting: Orthopaedic Surgery

## 2016-12-04 ENCOUNTER — Other Ambulatory Visit (INDEPENDENT_AMBULATORY_CARE_PROVIDER_SITE_OTHER): Payer: Self-pay | Admitting: Family

## 2016-12-05 ENCOUNTER — Other Ambulatory Visit (INDEPENDENT_AMBULATORY_CARE_PROVIDER_SITE_OTHER): Payer: Self-pay | Admitting: Family

## 2016-12-10 ENCOUNTER — Other Ambulatory Visit (INDEPENDENT_AMBULATORY_CARE_PROVIDER_SITE_OTHER): Payer: Self-pay | Admitting: Family

## 2016-12-10 ENCOUNTER — Encounter (INDEPENDENT_AMBULATORY_CARE_PROVIDER_SITE_OTHER): Payer: Self-pay | Admitting: Orthopaedic Surgery

## 2016-12-10 ENCOUNTER — Ambulatory Visit (INDEPENDENT_AMBULATORY_CARE_PROVIDER_SITE_OTHER): Payer: Managed Care, Other (non HMO)

## 2016-12-10 ENCOUNTER — Telehealth (INDEPENDENT_AMBULATORY_CARE_PROVIDER_SITE_OTHER): Payer: Self-pay | Admitting: Radiology

## 2016-12-10 ENCOUNTER — Ambulatory Visit (INDEPENDENT_AMBULATORY_CARE_PROVIDER_SITE_OTHER): Payer: Self-pay | Admitting: Physician Assistant

## 2016-12-10 DIAGNOSIS — Z96659 Presence of unspecified artificial knee joint: Secondary | ICD-10-CM

## 2016-12-10 DIAGNOSIS — M545 Low back pain: Secondary | ICD-10-CM

## 2016-12-10 DIAGNOSIS — T8484XD Pain due to internal orthopedic prosthetic devices, implants and grafts, subsequent encounter: Secondary | ICD-10-CM

## 2016-12-10 MED ORDER — CYCLOBENZAPRINE HCL 10 MG PO TABS: 10.0000 mg | ORAL_TABLET | Freq: Three times a day (TID) | ORAL | 1 refills | Status: DC | PRN

## 2016-12-10 MED ORDER — OXYCODONE-ACETAMINOPHEN 5-325 MG PO TABS: 1.0000 | ORAL_TABLET | Freq: Two times a day (BID) | ORAL | 0 refills | Status: DC | PRN

## 2016-12-10 MED ORDER — GABAPENTIN 100 MG PO CAPS: 100.0000 mg | ORAL_CAPSULE | ORAL | 0 refills | Status: DC

## 2016-12-10 MED ORDER — METHYLPREDNISOLONE 4 MG PO TABS: ORAL_TABLET | ORAL | 0 refills | Status: AC

## 2016-12-10 NOTE — Progress Notes (Signed)
Office Visit Note   Patient: Deborah [name]           Date of Birth: [birth year]           MRN: [id number] Visit Date: 12/10/2016              Requested by: Gillian Scarce, MD [address] [place], [place] [address] PCP: Gillian Scarce, MD   Assessment & Plan: Visit Diagnoses:  1. Low back pain, unspecified back pain laterality, unspecified chronicity, with sciatica presence unspecified   2. Pain due to unicompartmental arthroplasty of knee, subsequent encounter     Plan: We refilled her pain medication today and spoke with her at length about appropriate use of narcotics. Place her on a Medrol Dosepak no NSAIDs while on the Dosepak is discussed with her. Also placed her on gabapentin and Flexeril. She has a therapist that she's working with personally prescription given for some back exercises modalities IT band and hamstring stretching exercises. Like to see her back in 2 weeks check her progress or lack of. She continues to have radicular symptoms down the right leg then may consider MRI.  Follow-Up Instructions: No Follow-up on file.   Orders:  Orders Placed This Encounter  Procedures  . XR Lumbar Spine 2-3 Views   Meds ordered this encounter  Medications  . cyclobenzaprine (FLEXERIL) 10 MG tablet    Sig: Take 1 tablet (10 mg total) by mouth 3 (three) times daily as needed for muscle spasms.    Dispense:  40 tablet    Refill:  1  . methylPREDNISolone (MEDROL) 4 MG tablet    Sig: Take as directed    Dispense:  21 tablet    Refill:  0  . gabapentin (NEURONTIN) 100 MG capsule    Sig: Take 1 capsule (100 mg total) by mouth 1 day or 1 dose.    Dispense:  30 capsule    Refill:  0      Procedures: No procedures performed   Clinical Data: No additional findings.   Subjective: Chief Complaint  Patient presents with  . Right Knee - Routine Post Op, Follow-up    HPI Mrs. Gilder returns today follow-up of her right uni-compartmental arthroplasty 45  days. States she's had pain from the right hip down to the ankle describes it as a burning pain. She denies any low back pain. States the pain down her leg goes only to the ankle never into the foot and does awaken her at night. She's had no injury to her back. She's had no bowel or bladder dysfunction.  Review of Systems Please see history of present illness  Objective: Vital Signs: LMP 10/05/2013   Physical Exam  Constitutional: She appears well-developed and well-nourished. No distress.  Pulmonary/Chest: Effort normal.  Skin: She is not diaphoretic.  Psychiatric: Thought content normal. She is agitated.    Ortho Exam Right knee full extension flexion to 115 easily. No instability valgus varus stressing. No abnormal warmth erythema of fusion. Surgical incisions well-healed. Calf supple nontender. Lower extremity strength testing 5 out of 5 throughout except 4 out of 5 with extension of the right knee cancer resistance. Positive straight leg on the right. Good range of motion both hips without pain. She delivered touch her toes and has good extension of lumbar spine without pain. No tenderness with palpation of the lumbar spine. The tendon reflexes are 1+ at the knees and ankles and equal and symmetric bilaterally Specialty Comments:  No specialty comments available.  Imaging: Xr Lumbar Spine 2-3 Views  Result Date: 12/10/2016 AP lateral views lumbar spine: No acute fracture. Slight retrolisthesis L2 on L3. Disc space narrowing at L2-3 and L5-S1. Endplate spurring mid lumbar spine. Normal lordotic curvature is well maintained    PMFS History: Patient Active Problem List   Diagnosis Date Noted  . Status post right unicompartmental knee replacement 10/26/2016  . Primary osteoarthritis of right knee 08/21/2016  . IFG (impaired fasting glucose) 02/01/2014  . Flushing 02/01/2014  . GERD (gastroesophageal reflux disease) 12/20/2012  . Anxiety state, unspecified 12/20/2012  .  Essential hypertension, benign 12/20/2012  . Impaired fasting glucose 12/20/2012   Past Medical History:  Diagnosis Date  . Diabetes mellitus without complication (HCC)    type 2  . Family history of adverse reaction to anesthesia    father had [place] and vomiting  . GERD (gastroesophageal reflux disease)   . History of hiatal hernia   . Hypertension   . Impaired fasting glucose   . Rheumatic fever    2 valves that leak     Family History  Problem Relation Age of Onset  . Schizophrenia Mother   . Diabetes Mother        Type II  . Heart disease Father   . Hypertension Father   . Hyperlipidemia Father   . Heart disease Brother   . Hypertension Brother   . Cancer Brother        Colon Cancer  . CVA Maternal Aunt   . CVA Maternal Grandmother   . Heart disease Paternal Grandfather     Past Surgical History:  Procedure Laterality Date  . CERVICAL FUSION     C6  . CYST REMOVAL HAND  2010   Left Wrist  . FOOT SURGERY    . KNEE SURGERY  2009   Right Knee  . PARTIAL KNEE ARTHROPLASTY Right 10/26/2016   Procedure: RIGHT UNICOMPARTMENTAL KNEE ARTHROPLASTY;  Surgeon: Kathryne HitchBlackman, Christopher Y, MD;  Location: [facility];  Service: Orthopedics;  Laterality: Right;   Social History   Occupational History  . Not on file.   Social History Main Topics  . Smoking status: Never Smoker  . Smokeless tobacco: Never Used  . Alcohol use 18.6 oz/week    3 Cans of beer, 28 Shots of liquor per week  . Drug use: No  . Sexual activity: Yes    Partners: Female

## 2016-12-10 NOTE — Telephone Encounter (Signed)
Patients Pharmacist from [facility] called for gabapentin clarification.  Stated instructions were 1 day/dose for 30 day supply. Wanted to clarify 1 dose daily.

## 2016-12-10 NOTE — Telephone Encounter (Signed)
They are aware that is it ONCE a day

## 2016-12-14 ENCOUNTER — Encounter (INDEPENDENT_AMBULATORY_CARE_PROVIDER_SITE_OTHER): Payer: Self-pay | Admitting: Orthopaedic Surgery

## 2016-12-14 ENCOUNTER — Encounter (INDEPENDENT_AMBULATORY_CARE_PROVIDER_SITE_OTHER): Payer: Self-pay | Admitting: Physician Assistant

## 2016-12-25 ENCOUNTER — Encounter (INDEPENDENT_AMBULATORY_CARE_PROVIDER_SITE_OTHER): Payer: Self-pay | Admitting: Orthopaedic Surgery

## 2016-12-25 ENCOUNTER — Ambulatory Visit (INDEPENDENT_AMBULATORY_CARE_PROVIDER_SITE_OTHER): Payer: Self-pay | Admitting: Orthopaedic Surgery

## 2016-12-25 DIAGNOSIS — M5441 Lumbago with sciatica, right side: Secondary | ICD-10-CM | POA: Insufficient documentation

## 2016-12-25 DIAGNOSIS — Z96651 Presence of right artificial knee joint: Secondary | ICD-10-CM

## 2016-12-25 MED ORDER — CYCLOBENZAPRINE HCL 10 MG PO TABS: 10.0000 mg | ORAL_TABLET | Freq: Three times a day (TID) | ORAL | 0 refills | Status: AC | PRN

## 2016-12-25 MED ORDER — OXYCODONE-ACETAMINOPHEN 5-325 MG PO TABS: 1.0000 | ORAL_TABLET | Freq: Two times a day (BID) | ORAL | 0 refills | Status: AC | PRN

## 2016-12-25 NOTE — Progress Notes (Signed)
Ms. Harrison MonsBlake is now 2 months status post a right knee unicompartmental knee replacement. She reports that she is doing quite well. She's turned the corner in terms of range of motion of her knee and her pain is decreasing. She still having radicular symptoms in the sciatic region of her right low back rating down the back of her knee into her foot but is improving after steroid taper. She still reluctant to try any physical therapy she feels like she can do it on her own.  On examination she has negative straight leg raise to the right side. Swelling is minimal of her right knee. Range of motion is entirely full and she looks much more comfort for today. She is walking without assistive device.  At this point we'll continue 1 more prescription for Percocet as well as Flexeril. We'll keep her on half days only for the next 3 weeks. She can resume full duties at work starting 01/16/2017. We'll see her back for final visit in 4 weeks. No x-rays needed.

## 2016-12-27 ENCOUNTER — Encounter (INDEPENDENT_AMBULATORY_CARE_PROVIDER_SITE_OTHER): Payer: Self-pay | Admitting: Orthopaedic Surgery

## 2017-01-03 ENCOUNTER — Encounter (INDEPENDENT_AMBULATORY_CARE_PROVIDER_SITE_OTHER): Payer: Self-pay

## 2017-01-21 ENCOUNTER — Encounter (INDEPENDENT_AMBULATORY_CARE_PROVIDER_SITE_OTHER): Payer: Self-pay | Admitting: Orthopaedic Surgery

## 2017-01-24 ENCOUNTER — Ambulatory Visit (INDEPENDENT_AMBULATORY_CARE_PROVIDER_SITE_OTHER): Payer: Self-pay | Admitting: Physician Assistant

## 2017-01-24 DIAGNOSIS — Z96651 Presence of right artificial knee joint: Secondary | ICD-10-CM

## 2017-01-24 MED ORDER — HYDROCODONE-ACETAMINOPHEN 5-325 MG PO TABS: 1.0000 | ORAL_TABLET | Freq: Four times a day (QID) | ORAL | 0 refills | Status: AC | PRN

## 2017-01-24 NOTE — Progress Notes (Signed)
[name] is 3 months s/p right knee uni arthroplasty. Knee overall doing well. Some hamstring pain at times. Resolves with ice and rest.   Right knee: Surgical incision well healed . Full range of motion right knee without pain. Calf supple and non tender.   Plan: Continue quad strengthening. Scar tissue mobilization. Hamstring stretching. Follow with us in 3 months sooner if there is any concerns or questions.

## 2017-04-25 ENCOUNTER — Ambulatory Visit (INDEPENDENT_AMBULATORY_CARE_PROVIDER_SITE_OTHER): Payer: Managed Care, Other (non HMO) | Admitting: Physician Assistant

## 2017-04-25 ENCOUNTER — Encounter (INDEPENDENT_AMBULATORY_CARE_PROVIDER_SITE_OTHER): Payer: Self-pay | Admitting: Physician Assistant

## 2017-04-25 DIAGNOSIS — Z96651 Presence of right artificial knee joint: Secondary | ICD-10-CM

## 2017-04-25 DIAGNOSIS — M7061 Trochanteric bursitis, right hip: Secondary | ICD-10-CM | POA: Diagnosis not present

## 2017-04-25 MED ORDER — METHYLPREDNISOLONE ACETATE 40 MG/ML IJ SUSP
40.0000 mg | INTRAMUSCULAR | Status: AC | PRN
  Administered 2017-04-25: 40 mg via INTRA_ARTICULAR

## 2017-04-25 MED ORDER — LIDOCAINE HCL 1 % IJ SOLN
3.0000 mL | INTRAMUSCULAR | Status: AC | PRN
  Administered 2017-04-25: 3 mL

## 2017-04-25 NOTE — Progress Notes (Signed)
Office Visit Note   Patient: Deborah [name]           Date of Birth: 1961/02/13           MRN: [id number] Visit Date: 04/25/2017              Requested by: Gillian ScarceZanard, Robyn K, MD [address] [place], [place] [id number] PCP: Gillian ScarceZanard, Robyn K, MD   Assessment & Plan: Visit Diagnoses:  1. Status post right unicompartmental knee replacement   2. Trochanteric bursitis, right hip     Plan: She showed him stretching exercises.  Recommend walking no signs greater than 20 acres.  Also recommend that she stay off of grades that are greater than 1-3 slope.  Keep these restrictions in place until she follows up with us in 3 months.  Should follow-up sooner if there is any questions or concerns.  No radiographs at that time  Follow-Up Instructions: Return in about 3 months (around 07/26/2017).   Orders:  Orders Placed This Encounter  Procedures  . Large Joint Inj   No orders of the defined types were placed in this encounter.     Procedures: Large Joint Inj: R greater trochanter on 04/25/2017 1:19 PM Indications: pain Details: 22 G 1.5 in needle, lateral approach  Arthrogram: No  Medications: 3 mL lidocaine 1 %; 40 mg methylPREDNISolone acetate 40 MG/ML Outcome: tolerated well, no immediate complications Procedure, treatment alternatives, risks and benefits explained, specific risks discussed. Consent was given by the patient. Immediately prior to procedure a time out was called to verify the correct patient, procedure, equipment, support staff and site/side marked as required. Patient was prepped and draped in the usual sterile fashion.       Clinical Data: No additional findings.   Subjective: Chief Complaint  Patient presents with  . Right Knee - Pain, Follow-up    HPI She is status post right knee uni-compartment arthroplasty overall doing well.  She is having some pain lateral aspect of her right hip.  She denies any numbness tingling down the leg. Review  of Systems No numbness tingling down either leg.  No back pain.  Otherwise please see HPI  Objective: Vital Signs: LMP 10/05/2013   Physical Exam  Constitutional: She is oriented to person, place, and time. She appears well-developed and well-nourished. No distress.  Pulmonary/Chest: Effort normal.  Neurological: She is alert and oriented to person, place, and time.  Skin: She is not diaphoretic.  Psychiatric: She has a normal mood and affect.    Ortho Exam Excellent range of motion of the right knee without pain no instability valgus varus stressing.  Surgical incision is healed well no signs of infection.  No effusion.  Bilateral hip she has good range of motion of both hips without pain.  She has tenderness over the right trochanteric region and down the IT band. Specialty Comments:  No specialty comments available.  Imaging: No results found.   PMFS History: Patient Active Problem List   Diagnosis Date Noted  . Acute back pain with sciatica, right 12/25/2016  . Status post right unicompartmental knee replacement 10/26/2016  . Primary osteoarthritis of right knee 08/21/2016  . IFG (impaired fasting glucose) 02/01/2014  . Flushing 02/01/2014  . GERD (gastroesophageal reflux disease) 12/20/2012  . Anxiety state, unspecified 12/20/2012  . Essential hypertension, benign 12/20/2012  . Impaired fasting glucose 12/20/2012   Past Medical History:  Diagnosis Date  . Diabetes mellitus without complication (HCC)  type 2  . Family history of adverse reaction to anesthesia    father had [place] and vomiting  . GERD (gastroesophageal reflux disease)   . History of hiatal hernia   . Hypertension   . Impaired fasting glucose   . Rheumatic fever    2 valves that leak     Family History  Problem Relation Age of Onset  . Schizophrenia Mother   . Diabetes Mother        Type II  . Heart disease Father   . Hypertension Father   . Hyperlipidemia Father   . Heart disease Brother    . Hypertension Brother   . Cancer Brother        Colon Cancer  . CVA Maternal Aunt   . CVA Maternal Grandmother   . Heart disease Paternal Grandfather     Past Surgical History:  Procedure Laterality Date  . CERVICAL FUSION     C6  . CYST REMOVAL HAND  2010   Left Wrist  . FOOT SURGERY    . KNEE SURGERY  2009   Right Knee   Social History   Occupational History  . Not on file  Tobacco Use  . Smoking status: Never Smoker  . Smokeless tobacco: Never Used  Substance and Sexual Activity  . Alcohol use: Yes    Alcohol/week: 18.6 oz    Types: 3 Cans of beer, 28 Shots of liquor per week  . Drug use: No  . Sexual activity: Yes    Partners: Female

## 2017-04-29 ENCOUNTER — Ambulatory Visit (INDEPENDENT_AMBULATORY_CARE_PROVIDER_SITE_OTHER): Payer: Managed Care, Other (non HMO) | Admitting: Physician Assistant

## 2017-05-14 ENCOUNTER — Encounter (INDEPENDENT_AMBULATORY_CARE_PROVIDER_SITE_OTHER): Payer: Self-pay | Admitting: Radiology

## 2017-07-29 ENCOUNTER — Encounter (INDEPENDENT_AMBULATORY_CARE_PROVIDER_SITE_OTHER): Payer: Self-pay | Admitting: Physician Assistant

## 2017-07-29 ENCOUNTER — Ambulatory Visit (INDEPENDENT_AMBULATORY_CARE_PROVIDER_SITE_OTHER): Payer: Managed Care, Other (non HMO) | Admitting: Physician Assistant

## 2017-07-29 DIAGNOSIS — Z96651 Presence of right artificial knee joint: Secondary | ICD-10-CM | POA: Diagnosis not present

## 2017-07-29 DIAGNOSIS — M7061 Trochanteric bursitis, right hip: Secondary | ICD-10-CM | POA: Diagnosis not present

## 2017-07-29 NOTE — Progress Notes (Signed)
[name] returns today follow-up of her right knee unicompartmental arthroplasty.  She is now 9 months postop.  States the knee overall is doing well.  She feels her strength is getting better.  Knee does not feel tight.  She is very happy with the results.  She is also here for follow-up of her hip bursitis on the right which she is states therapy has helped with and also the shot was helpful with.  Review of systems: No fevers chills shortness of breath chest pain. Physical exam: Right knee has full extension full flexion.  No instability no abnormal warmth erythema.  Surgical incisions healed well. Right hip good range of motion without pain.  Minimal tenderness over the trochanteric region.  Impression: 9 months status post right unicompartmental knee arthroplasty  Resolving right trochanteric bursitis  Plan: She will continue work on quad strengthening and range of motion of the right knee.  Also continue IT band stretching exercises.  We will see her back in 3 months at that time we will obtain AP and lateral views of the right knee.  Questions were encouraged and answered.  We will continue to current restrictions of no ambulating on uneven ground with a greater than 3-1 slope.

## 2017-10-28 ENCOUNTER — Ambulatory Visit (INDEPENDENT_AMBULATORY_CARE_PROVIDER_SITE_OTHER): Payer: Managed Care, Other (non HMO)

## 2017-10-28 ENCOUNTER — Ambulatory Visit (INDEPENDENT_AMBULATORY_CARE_PROVIDER_SITE_OTHER): Payer: Managed Care, Other (non HMO) | Admitting: Physician Assistant

## 2017-10-28 ENCOUNTER — Encounter (INDEPENDENT_AMBULATORY_CARE_PROVIDER_SITE_OTHER): Payer: Self-pay | Admitting: Physician Assistant

## 2017-10-28 DIAGNOSIS — Z96651 Presence of right artificial knee joint: Secondary | ICD-10-CM | POA: Diagnosis not present

## 2017-10-28 NOTE — Progress Notes (Signed)
HPI: Deborah [name] returns today 1 year status post right uni-knee arthroplasty.  She is overall doing well.  Is been working on strengthening range of motion.  States she is much better.  She is wanting to return to work full duties without restrictions.    Physical exam: Right knee well-healed surgical incision.  Still stressing.  She has full extension full flexion.  Nontender about the knee.  No effusion  Radiographs:Right knee AP and  lateral views : No acute fracture . Components well seated no complicating features.   Impression: Status post right knee uni-compartment arthroplasty.  Plan: She will continue to work on strengthening range of motion.  No to return to work full duties without restrictions.  She will return on an as needed bases.

## 2018-04-02 IMAGING — DX DG KNEE 1-2V PORT*R*
2 series · 2 of 2 positions shown · non-contrast
Comparison: MRI a 09/21/2006

CLINICAL DATA: Status post hemiarthroplasty

EXAM:
PORTABLE RIGHT KNEE - 1-2 VIEW

[knee ap]
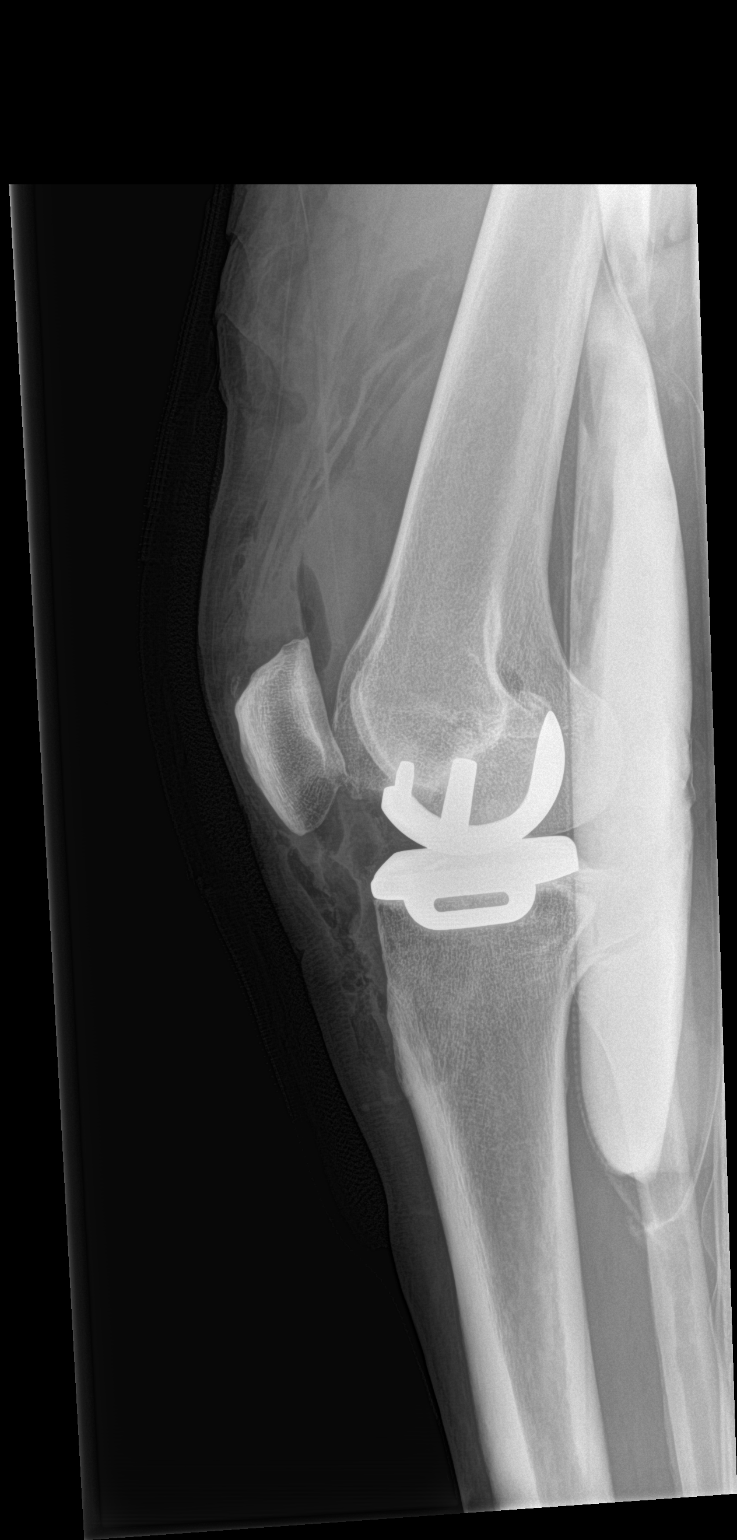

[knee lat]
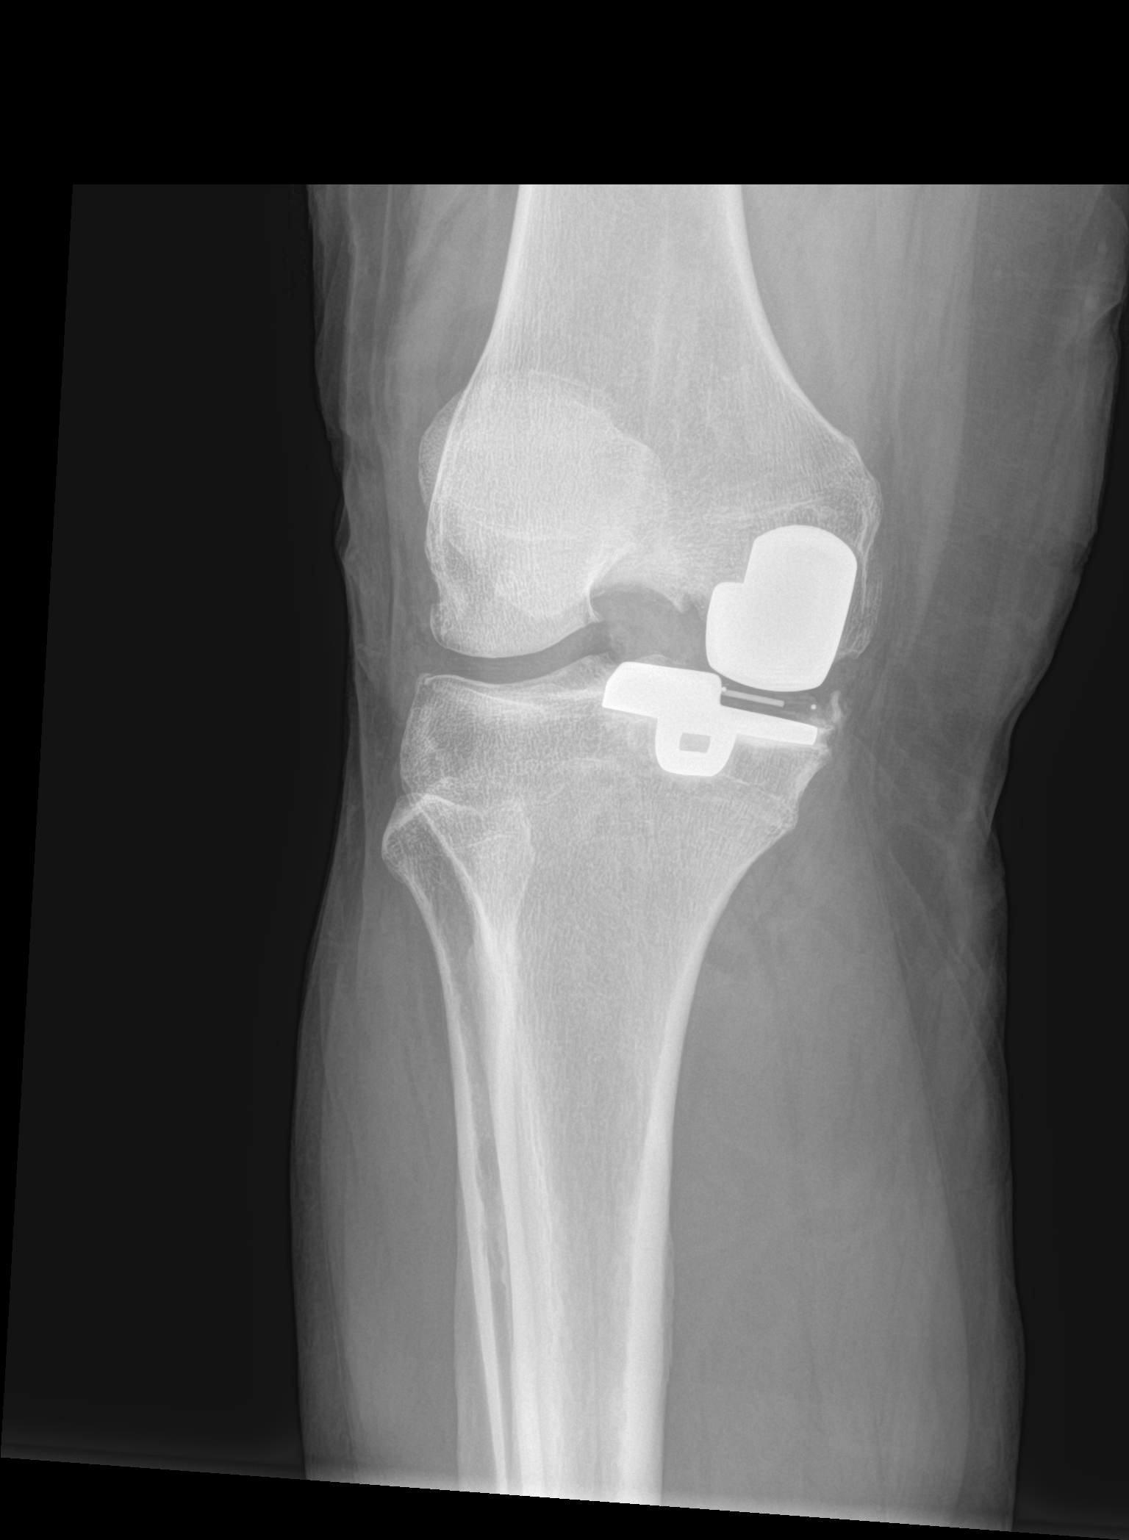

[2 of 2 positions shown; findings below may reference images not displayed]

FINDINGS: Medial compartment knee prosthesis is noted. The femoral and tibial
components appear well seated without complicating features.
IMPRESSION: Well seated components of a medial knee compartment
hemiarthroplasty. No complicating features.
